# Patient Record
Sex: Female | Born: 1980
Health system: Southern US, Community
[De-identification: ages and names within clinical notes are randomized; demographics above are authoritative.]

## PROBLEM LIST (undated history)

## (undated) DIAGNOSIS — I10 Essential (primary) hypertension: Secondary | ICD-10-CM

## (undated) DIAGNOSIS — N189 Chronic kidney disease, unspecified: Secondary | ICD-10-CM

## (undated) DIAGNOSIS — D496 Neoplasm of unspecified behavior of brain: Secondary | ICD-10-CM

## (undated) DIAGNOSIS — F1010 Alcohol abuse, uncomplicated: Secondary | ICD-10-CM

## (undated) DIAGNOSIS — R569 Unspecified convulsions: Secondary | ICD-10-CM

## (undated) HISTORY — DX: Essential (primary) hypertension: I10

## (undated) HISTORY — DX: Chronic kidney disease, unspecified: N18.9

## (undated) HISTORY — PX: BRAIN SURGERY: SHX531

## (undated) HISTORY — DX: Alcohol abuse, uncomplicated: F10.10

## (undated) HISTORY — DX: Unspecified convulsions: R56.9

---
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
## 2002-10-27 ENCOUNTER — Ambulatory Visit (HOSPITAL_COMMUNITY): Admission: RE | Admit: 2002-10-27 | Discharge: 2002-10-27 | Payer: Self-pay | Admitting: Neurology

## 2002-10-27 ENCOUNTER — Encounter: Payer: Self-pay | Admitting: Neurology

## 2005-05-30 ENCOUNTER — Ambulatory Visit: Payer: Self-pay | Admitting: Internal Medicine

## 2010-02-27 ENCOUNTER — Encounter: Payer: Self-pay | Admitting: Neurology

## 2011-06-08 ENCOUNTER — Emergency Department (HOSPITAL_COMMUNITY)
Admission: EM | Admit: 2011-06-08 | Discharge: 2011-06-08 | Disposition: A | Discharge status: Home / Self Care | Payer: BC Managed Care – PPO | Source: Home / Self Care | Attending: Emergency Medicine | Admitting: Emergency Medicine

## 2011-06-08 ENCOUNTER — Encounter (HOSPITAL_COMMUNITY): Payer: Self-pay | Admitting: Emergency Medicine

## 2011-06-08 ENCOUNTER — Emergency Department (INDEPENDENT_AMBULATORY_CARE_PROVIDER_SITE_OTHER): Payer: BC Managed Care – PPO

## 2011-06-08 DIAGNOSIS — S6000XA Contusion of unspecified finger without damage to nail, initial encounter: Secondary | ICD-10-CM

## 2011-06-08 DIAGNOSIS — S60019A Contusion of unspecified thumb without damage to nail, initial encounter: Secondary | ICD-10-CM

## 2011-06-08 DIAGNOSIS — T148XXA Other injury of unspecified body region, initial encounter: Secondary | ICD-10-CM

## 2011-06-08 DIAGNOSIS — IMO0002 Reserved for concepts with insufficient information to code with codable children: Secondary | ICD-10-CM

## 2011-06-08 DIAGNOSIS — X58XXXA Exposure to other specified factors, initial encounter: Secondary | ICD-10-CM

## 2011-06-08 HISTORY — DX: Neoplasm of unspecified behavior of brain: D49.6

## 2011-06-08 MED ORDER — HYDROCODONE-ACETAMINOPHEN 5-325 MG PO TABS: 1.0000 | ORAL_TABLET | Freq: Four times a day (QID) | ORAL | Status: AC | PRN

## 2011-06-08 NOTE — Discharge Instructions (Signed)
As discussed your x-rays were unremarkable there was no associated distal phalanx fracture. He sustained a flap type laceration that was not candidate for suture repair read discharge instructions as tolerated try to move her finger as tolerated and 3-5 days try to keep your thumb elevated as much as possible.

## 2011-06-08 NOTE — ED Provider Notes (Signed)
History     CSN: [id number]  Arrival date & time 06/08/11  0825   First MD Initiated Contact with Patient 06/08/11 [phone]      Chief Complaint  Patient presents with  . Laceration    (Consider location/radiation/quality/duration/timing/severity/associated sxs/prior treatment) HPI Comments: Patient states that last night she was attempting to close a sliding glass door when her thumb got caught and cut. She washed her thumb with water and apply a topical antibiotic last night. Had it covered with a Band-Aid it had some bleeding last night but it stopped. I know I got my tetanus shot 2009. His very painful and the tip of my thumb  Patient is a 31 y.o. female presenting with skin laceration. The history is provided by the patient.  Laceration  The incident occurred 12 to 24 hours ago. The laceration is located on the right hand. The laceration is 1 cm in size. Injury mechanism: crush type injure with metal door. The pain is moderate. The pain has been constant since onset. She reports no foreign bodies present. Her tetanus status is UTD.    Past Medical History  Diagnosis Date  . Brain tumor     BENIGN REMOVAL JULY 2001    Past Surgical History  Procedure Date  . Brain surgery     2001    No family history on file.  History  Substance Use Topics  . Smoking status: Current Everyday Smoker  . Smokeless tobacco: Not on file  . Alcohol Use: Yes    OB History    Grav Para Term Preterm Abortions TAB SAB Ect Mult Living                  Review of Systems  Constitutional: Negative for activity change and appetite change.  Skin: Positive for wound.  Neurological: Negative for weakness and numbness.    Allergies  Review of patient's allergies indicates no known allergies.  Home Medications   Current Outpatient Rx  Name Route Sig Dispense Refill  . LORATADINE 10 MG PO TABS Oral Take 10 mg by mouth daily.      LMP 05/17/2011  Physical Exam  Nursing note and vitals  reviewed. Constitutional: She appears well-developed and well-nourished. No distress.  Musculoskeletal: She exhibits tenderness.       Hands: Neurological: She is alert.  Skin: No rash noted. No erythema.    ED Course  Procedures (including critical care time)  Labs Reviewed - No data to display No results found.   No diagnosis found.    MDM  Flap-type laceration on the distal right thumb supraungueal- area. No subfoveal hematoma. Patient able to flex the IP and extend the finger properly. No distal vascular deficiencies. Laceration was not repairable sutures a small flap with a fragment of epidermis missing. We approximated the edges and perform wound care. We are ruling out a associated tuft fracture. X-rays have been ordered        Jimmie Molly, MD 06/08/11 [phone]

## 2011-06-08 NOTE — ED Notes (Signed)
HERE WITH 1 CM THUMB LACERATION AROUND NAILBED WITH MINIMAL BLEEDING CONTROLLED BY PRESSURE DRESSING S/P INJURY LAST NIGHT.OPT STATES SHE WAS ATTEMPTING TO CLOSE SLIDING GLASS DOOR WHEN DIGIT GOT CAUGHT.NO NUMB/TINGLING +ROM,BENDING.LAST TETANUS 2009.REMOVED BANDAID AND SATURATED GAUZE

## 2019-05-02 HISTORY — PX: ESOPHAGOGASTRODUODENOSCOPY: SHX1529

## 2019-05-15 ENCOUNTER — Encounter: Payer: Self-pay | Admitting: Nurse Practitioner

## 2019-05-15 ENCOUNTER — Other Ambulatory Visit: Payer: Self-pay

## 2019-05-15 ENCOUNTER — Ambulatory Visit (INDEPENDENT_AMBULATORY_CARE_PROVIDER_SITE_OTHER): Payer: PRIVATE HEALTH INSURANCE | Admitting: Nurse Practitioner

## 2019-05-15 ENCOUNTER — Telehealth: Payer: Self-pay | Admitting: Nurse Practitioner

## 2019-05-15 VITALS — BP 116/62 | HR 75 | Temp 97.8°F | Ht 68.0 in | Wt 183.4 lb

## 2019-05-15 DIAGNOSIS — K709 Alcoholic liver disease, unspecified: Secondary | ICD-10-CM

## 2019-05-15 DIAGNOSIS — R160 Hepatomegaly, not elsewhere classified: Secondary | ICD-10-CM

## 2019-05-15 DIAGNOSIS — K7682 Hepatic encephalopathy: Secondary | ICD-10-CM

## 2019-05-15 DIAGNOSIS — K7030 Alcoholic cirrhosis of liver without ascites: Secondary | ICD-10-CM | POA: Diagnosis not present

## 2019-05-15 DIAGNOSIS — D689 Coagulation defect, unspecified: Secondary | ICD-10-CM

## 2019-05-15 DIAGNOSIS — K7290 Hepatic failure, unspecified without coma: Secondary | ICD-10-CM | POA: Diagnosis not present

## 2019-05-15 DIAGNOSIS — K920 Hematemesis: Secondary | ICD-10-CM

## 2019-05-15 MED ORDER — RIFAXIMIN 550 MG PO TABS: 550.0000 mg | ORAL_TABLET | Freq: Two times a day (BID) | ORAL | 2 refills | Status: DC

## 2019-05-15 NOTE — Patient Instructions (Addendum)
If you are age 37 or older, your body mass index should be between 23-30. Your Body mass index is 27.88 kg/m. If this is out of the aforementioned range listed, please consider follow up with your Primary Care Provider.  If you are age 26 or younger, your body mass index should be between 19-25. Your Body mass index is 27.88 kg/m. If this is out of the aformentioned range listed, please consider follow up with your Primary Care Provider.   Please go to the lab at [facility] ([place] You will need to go to level "B", you do not need an appointment for this. Hours available are 7:30 am - 4:30 pm.  Please have these drawn tomorrow 05/16/19.  We have sent the following medications to your pharmacy for you to pick up at your convenience: Xifaxan 550 mg twice daily.   You have been referred to Cardiology, you will receive a call regarding this appointment.   Follow up in office in 1 week as scheduled.   Please purchase the following medications over the counter and take as directed: Multivitamin once daily.  Folic Acid 1 mg once daily.  Thiamine 50 mg once daily.   No alcohol use.   2 Gram Low Sodium Diet    Low-Sodium Eating Plan Sodium, which is an element that makes up salt, helps you maintain a healthy balance of fluids in your body. Too much sodium can increase your blood pressure and cause fluid and waste to be held in your body. Your health care provider or dietitian may recommend following this plan if you have high blood pressure (hypertension), kidney disease, liver disease, or heart failure. Eating less sodium can help lower your blood pressure, reduce swelling, and protect your heart, liver, and kidneys. What are tips for following this plan? General guidelines  Most people on this plan should limit their sodium intake to 1,500-2,000 mg (milligrams) of sodium each day. Reading food labels   The Nutrition Facts label lists the amount of sodium in one  serving of the food. If you eat more than one serving, you must multiply the listed amount of sodium by the number of servings.  Choose foods with less than 140 mg of sodium per serving.  Avoid foods with 300 mg of sodium or more per serving. Shopping  Look for lower-sodium products, often labeled as "low-sodium" or "no salt added."  Always check the sodium content even if foods are labeled as "unsalted" or "no salt added".  Buy fresh foods. ? Avoid canned foods and premade or frozen meals. ? Avoid canned, cured, or processed meats  Buy breads that have less than 80 mg of sodium per slice. Cooking  Eat more home-cooked food and less restaurant, buffet, and fast food.  Avoid adding salt when cooking. Use salt-free seasonings or herbs instead of table salt or sea salt. Check with your health care provider or pharmacist before using salt substitutes.  Cook with plant-based oils, such as canola, sunflower, or olive oil. Meal planning  When eating at a restaurant, ask that your food be prepared with less salt or no salt, if possible.  Avoid foods that contain MSG (monosodium glutamate). MSG is sometimes added to [place] food, bouillon, and some canned foods. What foods are recommended? The items listed may not be a complete list. Talk with your dietitian about what dietary choices are best for you. Grains Low-sodium cereals, including oats, puffed wheat and rice, and shredded wheat. Low-sodium crackers. Unsalted  rice. Unsalted pasta. Low-sodium bread. Whole-grain breads and whole-grain pasta. Vegetables Fresh or frozen vegetables. "No salt added" canned vegetables. "No salt added" tomato sauce and paste. Low-sodium or reduced-sodium tomato and vegetable juice. Fruits Fresh, frozen, or canned fruit. Fruit juice. Meats and other protein foods Fresh or frozen (no salt added) meat, poultry, seafood, and fish. Low-sodium canned tuna and salmon. Unsalted nuts. Dried peas, beans, and lentils  without added salt. Unsalted canned beans. Eggs. Unsalted nut butters. Dairy Milk. Soy milk. Cheese that is naturally low in sodium, such as ricotta cheese, fresh mozzarella, or Swiss cheese Low-sodium or reduced-sodium cheese. Cream cheese. Yogurt. Fats and oils Unsalted butter. Unsalted margarine with no trans fat. Vegetable oils such as canola or olive oils. Seasonings and other foods Fresh and dried herbs and spices. Salt-free seasonings. Low-sodium mustard and ketchup. Sodium-free salad dressing. Sodium-free light mayonnaise. Fresh or refrigerated horseradish. Lemon juice. Vinegar. Homemade, reduced-sodium, or low-sodium soups. Unsalted popcorn and pretzels. Low-salt or salt-free chips. What foods are not recommended? The items listed may not be a complete list. Talk with your dietitian about what dietary choices are best for you. Grains Instant hot cereals. Bread stuffing, pancake, and biscuit mixes. Croutons. Seasoned rice or pasta mixes. Noodle soup cups. Boxed or frozen macaroni and cheese. Regular salted crackers. Self-rising flour. Vegetables Sauerkraut, pickled vegetables, and relishes. Olives. [place] fries. Onion rings. Regular canned vegetables (not low-sodium or reduced-sodium). Regular canned tomato sauce and paste (not low-sodium or reduced-sodium). Regular tomato and vegetable juice (not low-sodium or reduced-sodium). Frozen vegetables in sauces. Meats and other protein foods Meat or fish that is salted, canned, smoked, spiced, or pickled. Bacon, ham, sausage, hotdogs, corned beef, chipped beef, packaged lunch meats, salt pork, jerky, pickled herring, anchovies, regular canned tuna, sardines, salted nuts. Dairy Processed cheese and cheese spreads. Cheese curds. Blue cheese. Feta cheese. String cheese. Regular cottage cheese. Buttermilk. Canned milk. Fats and oils Salted butter. Regular margarine. Ghee. Bacon fat. Seasonings and other foods Onion salt, garlic salt, seasoned salt,  table salt, and sea salt. Canned and packaged gravies. Worcestershire sauce. Tartar sauce. Barbecue sauce. Teriyaki sauce. Soy sauce, including reduced-sodium. Steak sauce. Fish sauce. Oyster sauce. Cocktail sauce. Horseradish that you find on the shelf. Regular ketchup and mustard. Meat flavorings and tenderizers. Bouillon cubes. Hot sauce and Tabasco sauce. Premade or packaged marinades. Premade or packaged taco seasonings. Relishes. Regular salad dressings. Salsa. Potato and tortilla chips. Corn chips and puffs. Salted popcorn and pretzels. Canned or dried soups. Pizza. Frozen entrees and pot pies. Summary  Eating less sodium can help lower your blood pressure, reduce swelling, and protect your heart, liver, and kidneys.  Most people on this plan should limit their sodium intake to 1,500-2,000 mg (milligrams) of sodium each day.  Canned, boxed, and frozen foods are high in sodium. Restaurant foods, fast foods, and pizza are also very high in sodium. You also get sodium by adding salt to food.  Try to cook at home, eat more fresh fruits and vegetables, and eat less fast food, canned, processed, or prepared foods. This information is not intended to replace advice given to you by your health care provider. Make sure you discuss any questions you have with your health care provider. Document Revised: 01/05/2017 Document Reviewed: 01/17/2016 Elsevier Patient Education  2020 [place] you,  Carl Best, NP

## 2019-05-15 NOTE — Telephone Encounter (Signed)
Rec'd records from [facility] forwarded 60 pages tp Carl Best

## 2019-05-15 NOTE — Progress Notes (Signed)
05/15/2019 Frances Williams [id number] 1980-04-08   CHIEF COMPLAINT: Liver disease   HISTORY OF PRESENT ILLNESS:  Frances Williams is a 39 year old female with a past medical history of hypertension alcoholism. 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 She presents today for further evaluation for alcoholic cirrhosis. She was residing in [place] and she traveled to [place], [place] 4 days ago to be with her family members.  She is accompanied by her significant other, Frances Williams. He is assisting with obtaining her history.  She was admitted to [facility] in [place] 05/01/2019 with altered mental status, hematemesis with newly diagnosed alcoholic liver disease. 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 She initially vomited partial digested food the bilious emesis. She then vomited coffee ground hematemesis. Her family assessed she had altered mental status and they called 911. She was actively having hematemesis when the EMS paramedics arrive. She was nearly obtunded and she was intubated by EMS paramedic with concerns of airway compromise. She was initially transported to the local hospital in [place] then air lifted by helicopter to [facility] in [place]. She was admitted to the ICU hypotensive in septic shock, actively GI bleeding and AKI. She remained intubated and sedated. Admission WBC 46.41. Hg 6.7 -> 5.2. She was transfused 2 units of PRBCs. INR 3. She received 3 units of FFP and Vitamin K 27m IV. PLT 238. She was was started on a Norepinephrine gtt, Octreotide infusion and Pantoprazole 860mhr infusion. She received Ceftriaxone 2gm IV. Rectal exam showed melenic stool guaiac positive. EKG showed ST-Twave changes due to demand ischemia with elevated Troponin level 668. BNP 308. Lactic acid 14.3. Ammonia 143. Na+ 128. K+5.4. Cr. 2.5. T. Bili 3.0. Alk phos 97. ALT  78. AST 403. Lipase 77. Albumin 2.8. TSH 2.27. Acetaminophen level < 10. Urine tox screen negative.  A chest/abd/pelvic CT angiogram was negative for PE, hepatomegaly was identified and peripancreatic stranding with fluid collection suggesting pancreatitis was present. Lipase levels were normal. CT of the head without evidence of stroke of hemorrhage. She was seen by cardiology who reviewed her EKG showed tachycardia and possible recent inferior MI. The cardiologist did not assess she was a candidate for any invasive cardiac evaluation at that time. An ECHO done while she was tachycardic showed the left ventricle was hyperdynamic with LV EF 80%, mild LVH, normal chamber sizes, doppler studies were unremarkable.  She underwent an EGD 3/26 which revealed esophageal ulcerations, nonbleeding duodenal  bulb ulcers and chronic gastropathy. No evidence of esophageal or gastric varices. Blood cultures were negative. She had persistent fevers. Tracheal aspirate cultures grew Methicillin-susceptible staphylococcus aureus. ID was consulted and her antibiotics were switched to Cefazolin and Clindamycin. An abdominal sonogram 4/1 showed mild diffuse gallbladder wall thickening and mild para cholecystic fluid, slightly dilated CBD without evidence of gallstones. Acute cholecystitis was assessed as unlikely. Ascites was identified. She underwent a paracentesis 05/09/2019, 60056mf peritoneal fluid was removed without evidence of SBP. Peritoneal fluid culture negative after 72 hours. Her respiratory status improved and she was extubated without difficulty. AKI resolved. Multifactorial encephalopathy resolved on Lactulose. No further evidence of GI bleeding. Her Hg stabilized 8.5 - 9. Coagulopathy stabilized.   Laboratory studies prior to discharge 05/12/2018: WBC 11.6. Hg 9.0. HCT 26.6. MCV 93.1. PLT 207. INR 1.3. (on 4/2).  Glu 92. BUN 7. Cr. 0.43. Na 138. K+ 3.4.  Albumin 3.3. Alk phos 88. ALT 74. AST 175. T. Bili 2.5.    Medications at time of discharge:  Aspirin 81 mg daily (she is not taking). Escitalopram 39m daily.  Furosemide 412mdaily. Spironolactone 5058maily. Metoprolol 12.5 mg bid. Pantoprazole 39m36m bid. Rifaxamin 550mg4mbid (patient has not started) Lactulose (10gm/15ml 91m ML bid   Currently, she is fatigued. No cough but she has dyspnea with activity. No chest pain or palpitations. She denies having any nausea or vomiting. No upper or lower abdominal pain. She is passing 4 to 6 soft brown to loose stools daily since taking Lactulose. No prior history of rectal bleeding or melena. She is urinating frequently, urine is clear yellow urine. She denies having current confusion. Her boyfriend does not assess she has confusion since she was discharged from the hospital. She bruises easily for the past 3 months and she had a few minor nose bleeds as well. She complains of numbness to her feet and cramping in her calves for the past few months. She has a history of alcoholism since her early 20's. 30'sdrank 3 beers daily since the age of 23. Fo95the past 1 1/2 years she has been drinking seven 50ml b49mes of fireball whiskey. She smoked crack cocaine x 2 7 or 8 years ago. No history of IV drug use. Past marijuana use. She denied Tylenol overdose.  No family history of liver disease.  [facility]    Past Medical History:  Diagnosis Date   Brain [facility] (HCC)  [facility]   Past Surgical History:  Procedure Laterality Date   BRAIN SURGERY     2001 on temporal lobe    Social History: She is single. Past smoker. Alcohol use see HPI. Crack cocaine use x 2 6 or 8 years ago. Marijuana use.   Family History: No family history of liver disease, esophageal, gastric or colon cancer.     Outpatient Encounter Medications as of 05/15/2019  Medication Sig   escitalopram (LEXAPRO) 20 MG tablet Take 20 mg by  mouth daily.   furosemide (LASIX) 40 MG tablet Take 40 mg by mouth daily.   lactulose (CHRONULAC) 10 GM/15ML solution Take 30 g by mouth every 12 (twelve) hours. Currently doing 30ml 2 60ms daily. Was prescribe 45ml 2 t44m daily   metoprolol tartrate (LOPRESSOR) 25 MG tablet Take 12.5 mg by mouth 2 (two) times daily.   pantoprazole (PROTONIX) 40 MG tablet Take 40 mg by mouth 2 (two) times daily.   spironolactone (ALDACTONE) 50 MG tablet Take 50 mg by mouth daily.   [DISCONTINUED] loratadine (CLARITIN) 10 MG tablet Take 10 mg by mouth daily.   No facility-administered encounter medications on file as of 05/15/2019.     REVIEW OF SYSTEMS: All other systems reviewed and negative except where noted in the History of Present Illness.   PHYSICAL EXAM: Temp 97.8 F (36.6 C)    Ht 5' 8"  (1.727 m)    Wt 183 lb 6 oz (83.2 kg)    BMI 27.88 kg/m    General: Fatigued appearing female in NAD.  Head: Normocephalic and atraumatic. Eyes:  Sclerae non-icteric, conjunctive pink. Right subconjuctival hemorrhage.  Ears: Normal auditory acuity. Mouth: Dentition intact. No ulcers or lesions.  Neck: Supple, no lymphadenopathy or thyromegaly.  Lungs: Clear bilaterally to auscultation without  wheezes, crackles or rhonchi. Heart: Regular rate and rhythm. No murmur, rub or gallop appreciated.  Abdomen: Soft. Nondistended. Nontender. Hepatomegaly with pronounced left hepatic border palpated 4 to 5 cm below the medial costal margin. No obvious ascites. + BS x 4 quadrants.   Rectal: Deferred.  Musculoskeletal: Symmetrical with no gross deformities. Skin: Warm and dry. No rash or lesions on visible extremities. No jaundice.  Extremities: 1+ LE edema L > R. Negative Homan's sign.  Neurological: Alert oriented x 4, no focal deficits. No asterixis.  Psychological:  Alert and cooperative. Normal mood and affect.  ASSESSMENT AND PLAN:  18. 40 year old female admitted to [facility] [place] 05/01/2019 with  hematemesis/UGI and anemia in setting of newly diagnosed EtOH liver disease/hepatomegaly/acites in setting of septic shock, coagulopathy, respiratory failure secondary to aspiration pneumonia and AKI. Most likely has cirrhosis. No esophageal or gastric varices per EGD 3/26.  -CBC, BMP, Hepatic panel, AMA, SMA, ANA, IgG, Hep A total ab, Hep B core total antibody, Hep B surface antigen. Hep B surface antibody, Ceruloplasmin, A1AT,  PT/INR, Ammonia, AFP.  -Continue Furosemide 49m QD and Spironolactone 570mdaily -Start Xifaxan 55026mne po bid  -Lactulose (10gm/77m37m0ml62m titrate to 3 to 4 soft to loose BMs daily -Continue Pantoprazole 40mg 65mDiscussed no alcohol ever, if she continues to drink alcohol she will die -Agree with inpatient alcohol rehab  -2Gm low sodium diet -Thiamin 50mg o17mab daily. Multi vitamin daily. Folate 1mg dai26m  -I would not fluid restrict unless Na+ level 125 or less -Metoprolol use to be verified by cardiology in setting of possible recent MI. -Monitor weight  -Follow up in office in 1 week  2. Encephalopathy, metabolic/sepsis + alcoholic liver disease, currently resolved  -Xifaxan and Lactulose, see plan in # 1  3. Coagulopathy secondary to liver disease   4. Thrombocytopenia secondary to sepsis + liver disease. PLT 239 -> 140 -> 89 -> 92 -> 113 -> 207.   5. ? Pancreatitis per abd CT angiogram 3/25. Normal lipase level.   6. Acute MI verses demand ischemia -Cardiology consult  Consult today include 60 minutes face to face time with patient and review of 60+ pages of hospital records.     CC:  No ref. provider found

## 2019-05-16 ENCOUNTER — Other Ambulatory Visit (INDEPENDENT_AMBULATORY_CARE_PROVIDER_SITE_OTHER): Payer: PRIVATE HEALTH INSURANCE

## 2019-05-16 DIAGNOSIS — R160 Hepatomegaly, not elsewhere classified: Secondary | ICD-10-CM

## 2019-05-16 DIAGNOSIS — K7290 Hepatic failure, unspecified without coma: Secondary | ICD-10-CM | POA: Diagnosis not present

## 2019-05-16 DIAGNOSIS — K7030 Alcoholic cirrhosis of liver without ascites: Secondary | ICD-10-CM | POA: Diagnosis not present

## 2019-05-16 DIAGNOSIS — K7682 Hepatic encephalopathy: Secondary | ICD-10-CM

## 2019-05-16 DIAGNOSIS — K920 Hematemesis: Secondary | ICD-10-CM

## 2019-05-16 DIAGNOSIS — K709 Alcoholic liver disease, unspecified: Secondary | ICD-10-CM

## 2019-05-16 DIAGNOSIS — D689 Coagulation defect, unspecified: Secondary | ICD-10-CM

## 2019-05-16 HISTORY — DX: Alcoholic liver disease, unspecified: K70.9

## 2019-05-16 HISTORY — DX: Hematemesis: K92.0

## 2019-05-16 HISTORY — DX: Hepatomegaly, not elsewhere classified: R16.0

## 2019-05-16 LAB — CBC WITH DIFFERENTIAL/PLATELET
Basophils Absolute: 0.1 10*3/uL (ref 0.0–0.1)
Basophils Relative: 1.4 % (ref 0.0–3.0)
Eosinophils Absolute: 0.1 10*3/uL (ref 0.0–0.7)
Eosinophils Relative: 1.4 % (ref 0.0–5.0)
HCT: 31.2 % — ABNORMAL LOW (ref 36.0–46.0)
Hemoglobin: 10.3 g/dL — ABNORMAL LOW (ref 12.0–15.0)
Lymphocytes Relative: 30.3 % (ref 12.0–46.0)
Lymphs Abs: 2.8 10*3/uL (ref 0.7–4.0)
MCHC: 32.9 g/dL (ref 30.0–36.0)
MCV: 93.4 fl (ref 78.0–100.0)
Monocytes Absolute: 0.7 10*3/uL (ref 0.1–1.0)
Monocytes Relative: 7.8 % (ref 3.0–12.0)
Neutro Abs: 5.4 10*3/uL (ref 1.4–7.7)
Neutrophils Relative %: 59.1 % (ref 43.0–77.0)
Platelets: 250 10*3/uL (ref 150.0–400.0)
RBC: 3.34 Mil/uL — ABNORMAL LOW (ref 3.87–5.11)
RDW: 19.4 % — ABNORMAL HIGH (ref 11.5–15.5)
WBC: 9.1 10*3/uL (ref 4.0–10.5)

## 2019-05-16 LAB — HEPATIC FUNCTION PANEL
ALT: 52 U/L — ABNORMAL HIGH (ref 0–35)
AST: 92 U/L — ABNORMAL HIGH (ref 0–37)
Albumin: 4 g/dL (ref 3.5–5.2)
Alkaline Phosphatase: 103 U/L (ref 39–117)
Bilirubin, Direct: 1 mg/dL — ABNORMAL HIGH (ref 0.0–0.3)
Total Bilirubin: 3.3 mg/dL — ABNORMAL HIGH (ref 0.2–1.2)
Total Protein: 7.7 g/dL (ref 6.0–8.3)

## 2019-05-16 LAB — AMMONIA: Ammonia: 105 umol/L — ABNORMAL HIGH (ref 11–35)

## 2019-05-16 LAB — BASIC METABOLIC PANEL
BUN: 10 mg/dL (ref 6–23)
CO2: 24 mEq/L (ref 19–32)
Calcium: 9.9 mg/dL (ref 8.4–10.5)
Chloride: 102 mEq/L (ref 96–112)
Creatinine, Ser: 0.63 mg/dL (ref 0.40–1.20)
GFR: 106.58 mL/min (ref >60.00)
Glucose, Bld: 115 mg/dL — ABNORMAL HIGH (ref 70–99)
Potassium: 3.9 mEq/L (ref 3.5–5.1)
Sodium: 137 mEq/L (ref 135–145)

## 2019-05-16 LAB — PROTIME-INR
INR: 1.4 ratio — ABNORMAL HIGH (ref 0.8–1.0)
Prothrombin Time: 15.1 s — ABNORMAL HIGH (ref 9.6–13.1)

## 2019-05-16 LAB — IBC + FERRITIN
Ferritin: 125.4 ng/mL (ref 10.0–291.0)
Iron: 31 ug/dL — ABNORMAL LOW (ref 42–145)
Saturation Ratios: 8.3 % — ABNORMAL LOW (ref 20.0–50.0)
Transferrin: 266 mg/dL (ref 212.0–360.0)

## 2019-05-16 LAB — TSH: TSH: 3.87 u[IU]/mL (ref 0.35–4.50)

## 2019-05-16 LAB — FOLATE: Folate: >23.7 ng/mL (ref >5.9)

## 2019-05-16 LAB — VITAMIN B12: Vitamin B-12: 1131 pg/mL — ABNORMAL HIGH (ref 211–911)

## 2019-05-20 ENCOUNTER — Encounter: Payer: Self-pay | Admitting: Cardiology

## 2019-05-20 ENCOUNTER — Other Ambulatory Visit: Payer: Self-pay

## 2019-05-20 ENCOUNTER — Ambulatory Visit (INDEPENDENT_AMBULATORY_CARE_PROVIDER_SITE_OTHER): Payer: PRIVATE HEALTH INSURANCE | Admitting: Cardiology

## 2019-05-20 DIAGNOSIS — I252 Old myocardial infarction: Secondary | ICD-10-CM | POA: Diagnosis not present

## 2019-05-20 DIAGNOSIS — K920 Hematemesis: Secondary | ICD-10-CM

## 2019-05-20 DIAGNOSIS — K709 Alcoholic liver disease, unspecified: Secondary | ICD-10-CM | POA: Diagnosis not present

## 2019-05-20 HISTORY — DX: Old myocardial infarction: I25.2

## 2019-05-20 NOTE — Patient Instructions (Signed)

## 2019-05-20 NOTE — Progress Notes (Signed)
Cardiology Consultation:    Date:  05/20/2019   ID:  Frances Williams, DOB October 02, 1980, MRN [id number]  PCP:  Patient, No Pcp Per  Cardiologist:  Jenne Campus, MD   Referring MD: Carl Best *   No chief complaint on file. Complaint bladder  History of Present Illness:    Frances Williams is a 39 y.o. female who is being seen today for the evaluation of history of myocardial infarction at the request of Frances Williams, 3 *. old female with a past medical history of hypertension alcoholism. 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 She presents today for further evaluation for alcoholic cirrhosis. She was residing in [place] and she traveled to [place], [place] 4 days ago to be with her family members.  She is accompanied by her significant other, Rick. He is assisting with obtaining her history.  She was admitted to [facility] in [place] 05/01/2019 with altered mental status, hematemesis with newly diagnosed alcoholic liver disease. 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 She initially vomited partial digested food the bilious emesis. She then vomited coffee Williams hematemesis. Her family assessed she had altered mental status and they called 911. She was actively having hematemesis when the EMS paramedics arrive. She was nearly obtunded and she was intubated by EMS paramedic with concerns of airway compromise. She was initially transported to the local hospital in [place] then air lifted by helicopter to [facility] in [place]. She was admitted to the ICU hypotensive in septic shock, actively GI bleeding and AKI. She remained intubated and sedated. Admission WBC 46.41. Hg 6.7 -> 5.2. She was transfused 2 units of PRBCs. INR 3. She received 3 units of FFP and Vitamin K 52m IV. PLT 238. She was was started on a Norepinephrine gtt,  Octreotide infusion and Pantoprazole 824mhr infusion. She received Ceftriaxone 2gm IV. Rectal exam showed melenic stool guaiac positive. EKG showed ST-Twave changes due to demand ischemia with elevated Troponin level 668. BNP 308. Lactic acid 14.3. Ammonia 143. Na+ 128. K+5.4. Cr. 2.5. T. Bili 3.0. Alk phos 97. ALT 78. AST 403. Lipase 77. Albumin 2.8. TSH 2.27. Acetaminophen level < 10. Urine tox screen negative.  A chest/abd/pelvic CT angiogram was negative for PE, hepatomegaly was identified and peripancreatic stranding with fluid collection suggesting pancreatitis was present. Lipase levels were normal. CT of the head without evidence of stroke of hemorrhage. She was seen by cardiology who reviewed her EKG showed tachycardia and possible recent inferior MI. The cardiologist did not assess she was a candidate for any invasive cardiac evaluation at that time. An ECHO done while she was tachycardic showed the left ventricle was hyperdynamic with LV EF 80%, mild LVH, normal chamber sizes, doppler studies were unremarkable.  She underwent an EGD 3/26 which revealed esophageal ulcerations, nonbleeding duodenal  bulb ulcers and chronic gastropathy. No evidence of esophageal or gastric varices. Blood cultures were negative. She had persistent fevers. Tracheal aspirate cultures grew Methicillin-susceptible staphylococcus aureus. ID was consulted and her antibiotics were switched to Cefazolin and Clindamycin. An abdominal sonogram 4/1 showed mild diffuse gallbladder wall thickening and mild para cholecystic fluid, slightly dilated CBD without evidence of gallstones. Acute cholecystitis was assessed as unlikely. Ascites was identified. She underwent a paracentesis 05/09/2019, 6007mf peritoneal fluid was removed without evidence of SBP. Peritoneal fluid culture negative after  72 hours. Her respiratory status improved and she was extubated without difficulty. AKI resolved. Multifactorial encephalopathy resolved on Lactulose.  No further evidence of GI bleeding. Her Hg stabilized 8.5 - 9. Coagulopathy stabilized.   This is a quite incredible story.  In summary of she did have acute coronary syndrome type II related to severe anemia as well as hypotension.  Obviously anticoagulation is out of the question of recent history of GI bleeding on top of that with acute coronary syndrome that could smoke beneficial.  She is already on small dose of beta-blocker which is probably appropriate.  Likely description of echocardiogram that I see above show normal ejection fraction.  Overall she is doing well.  She denies have any chest pain tightness squeezing pressure in the chest that she does have fatigue and tiredness while walking.  Past Medical History:  Diagnosis Date  . Alcohol abuse   . Alcoholic liver disease ([place]) 05/16/2019  . Brain tumor ([place])    BENIGN REMOVAL JULY 2001  . Chronic kidney disease   . Hematemesis 05/16/2019  . Hepatomegaly 05/16/2019  . Hypertension   . Seizure [facility])     Past Surgical History:  Procedure Laterality Date  . BRAIN SURGERY     2001 on temporal lobe  . ESOPHAGOGASTRODUODENOSCOPY  05/02/2019   [facility] [place]    Current Medications: Current Meds  Medication Sig  . escitalopram (LEXAPRO) 20 MG tablet Take 20 mg by mouth daily.  . furosemide (LASIX) 40 MG tablet Take 40 mg by mouth daily.  Marland Kitchen lactulose (CHRONULAC) 10 GM/15ML solution Take 45 g by mouth 2 (two) times daily.  . metoprolol tartrate (LOPRESSOR) 25 MG tablet Take 12.5 mg by mouth 2 (two) times daily.  . pantoprazole (PROTONIX) 40 MG tablet Take 40 mg by mouth 2 (two) times daily.     Allergies:   Patient has no known allergies.   Social History   Socioeconomic History  . Marital status: Single    Spouse name: Not on file  . Number of children: Not on file  . Years of education: Not on file  . Highest education level: Not on file  Occupational History  . Not on file  Tobacco Use  . Smoking status:  Former Research scientist (life sciences)  . Smokeless tobacco: Never Used  . Tobacco comment: quit 05/01/2019  Substance and Sexual Activity  . Alcohol use: Not Currently    Comment: quit 05/01/2019  . Drug use: Not Currently    Types: "Crack" cocaine, Marijuana  . Sexual activity: Not on file  Other Topics Concern  . Not on file  Social History Narrative  . Not on file   Social Determinants of Health   Financial Resource Strain:   . Difficulty of Paying Living Expenses:   Food Insecurity:   . Worried About Charity fundraiser in the Last Year:   . Arboriculturist in the Last Year:   Transportation Needs:   . Film/video editor (Medical):   Marland Kitchen Lack of Transportation (Non-Medical):   Physical Activity:   . Days of Exercise per Week:   . Minutes of Exercise per Session:   Stress:   . Feeling of Stress :   Social Connections:   . Frequency of Communication with Friends and Family:   . Frequency of Social Gatherings with Friends and Family:   . Attends Religious Services:   . Active Member of Clubs or Organizations:   . Attends Archivist Meetings:   .  Marital Status:      Family History: The patient's family history is negative for Colon cancer and Esophageal cancer. ROS:   Please see the history of present illness.    All 14 point review of systems negative except as described per history of present illness.  EKGs/Labs/Other Studies Reviewed:    The following studies were reviewed today:   EKG:  EKG is  ordered today.  The ekg ordered today demonstrates normal sinus rhythm, normal P interval, normal QS complex duration morphology no ST segment changes  Recent Labs: 05/16/2019: ALT 52; BUN 10; Creatinine, Ser 0.63; Hemoglobin 10.3; Platelets 250.0; Potassium 3.9; Sodium 137; TSH 3.87  Recent Lipid Panel No results found for: CHOL, TRIG, HDL, CHOLHDL, VLDL, LDLCALC, LDLDIRECT  Physical Exam:    VS:  BP 126/70   Pulse 69   Temp 97.7 F (36.5 C)   Ht 5' 8.5" (1.74 m)   Wt 212  lb 12.8 oz (96.5 kg)   SpO2 96%   BMI 31.89 kg/m     Wt Readings from Last 3 Encounters:  05/20/19 212 lb 12.8 oz (96.5 kg)  05/15/19 183 lb 6 oz (83.2 kg)     GEN:  Well nourished, well developed in no acute distress multiple spider angiomas in the chest wall.  HEENT: Normal NECK: No JVD; No carotid bruits LYMPHATICS: No lymphadenopathy CARDIAC: RRR, no murmurs, no rubs, no gallops RESPIRATORY:  Clear to auscultation without rales, wheezing or rhonchi  ABDOMEN: Soft, non-tender, non-distended, hepatomegaly MUSCULOSKELETAL:  No edema; No deformity  SKIN: Warm and dry NEUROLOGIC:  Alert and oriented x 3 PSYCHIATRIC:  Normal affect   ASSESSMENT:    1. Alcoholic liver disease ([place])   2. Hematemesis, presence of nausea not specified   3. History of MI (myocardial infarction)    PLAN:    In order of problems listed above:  1. History of abnormal troponin I which is most likely acute coronary syndrome type II related to hypoxia, hypotension as well as severe anemia.  I do not believe for dealing with traumatic event.  Therefore, the situation anticoagulation is not advisable.  Also review of her varicosis with recent bleeding antiplatelet agent is not advisable as well.  I think it will be decent to continue with small dose of beta-blocker.  In the future I will schedule him to have repeated echocardiogram. 2. Alcoholic liver disease follow-up.GI.  Her H&H is stable.  Last hemoglobin is 10.2. 3. History of MI and again this is acute coronary syndrome from an acute myocardial infarction type II.  In the future we will also check fasting lipid profile however you/office letter will be questionable because of her chronic liver problem.  Luckily it looks like she is not drinking anymore.  She used to be in the [place] but now she moved in here.  She lives with her mother.  She does have a boyfriend who is very supportive.  She is also getting ready to check into the rehab and she will  go there tomorrow.   Medication Adjustments/Labs and Tests Ordered: Current medicines are reviewed at length with the patient today.  Concerns regarding medicines are outlined above.  No orders of the defined types were placed in this encounter.  No orders of the defined types were placed in this encounter.   Signed, Park Liter, MD, [facility]. 05/20/2019 11:59 AM    [place]

## 2019-05-22 LAB — MITOCHONDRIAL ANTIBODIES: Mitochondrial M2 Ab, IgG: <=20 U

## 2019-05-22 LAB — HEPATITIS C ANTIBODY
Hepatitis C Ab: NONREACTIVE
SIGNAL TO CUT-OFF: 0.02 (ref <1.00)

## 2019-05-22 LAB — HEPATITIS A ANTIBODY, TOTAL: Hepatitis A AB,Total: REACTIVE — AB

## 2019-05-22 LAB — ANA: Anti Nuclear Antibody (ANA): NEGATIVE

## 2019-05-22 LAB — HEPATITIS B SURFACE ANTIGEN: Hepatitis B Surface Ag: NONREACTIVE

## 2019-05-22 LAB — IGG: IgG (Immunoglobin G), Serum: 1468 mg/dL (ref 600–1640)

## 2019-05-22 LAB — HEPATITIS B SURFACE ANTIBODY,QUALITATIVE: Hep B S Ab: REACTIVE — AB

## 2019-05-22 LAB — HEPATITIS B CORE ANTIBODY, IGM: Hep B C IgM: NONREACTIVE

## 2019-05-22 LAB — ALPHA-1-ANTITRYPSIN: A-1 Antitrypsin, Ser: 271 mg/dL — ABNORMAL HIGH (ref 83–199)

## 2019-05-22 LAB — AFP TUMOR MARKER: AFP-Tumor Marker: 10.6 ng/mL — ABNORMAL HIGH

## 2019-05-22 LAB — ANTI-SMOOTH MUSCLE ANTIBODY, IGG: Actin (Smooth Muscle) Antibody (IGG): <20 U (ref <20)

## 2019-05-22 LAB — CERULOPLASMIN: Ceruloplasmin: 30 mg/dL (ref 18–53)

## 2019-05-25 NOTE — Progress Notes (Deleted)
     05/25/2019 AARIKA DAVISON NR:[id number] 1981/01/23   Chief Complaint:  History of Present Illness:  ***  Current Medications, Allergies, Past Medical History, Past Surgical History, Family History and Social History were reviewed in [facility] record.   Physical Exam: There were no vitals taken for this visit. General: Well developed, w   ***female in no acute distress. Head: Normocephalic and atraumatic. Eyes: No scleral icterus. Conjunctiva pink . Ears: Normal auditory acuity. Mouth: Dentition intact. No ulcers or lesions.  Lungs: Clear throughout to auscultation. Heart: Regular rate and rhythm, no murmur. Abdomen: Soft, nontender and nondistended. No masses or hepatomegaly. Normal bowel sounds x 4 quadrants.  Rectal: *** Musculoskeletal: Symmetrical with no gross deformities. Extremities: No edema. Neurological: Alert oriented x 4. No focal deficits.  Psychological: Alert and cooperative. Normal mood and affect  Assessment and Recommendations: ***

## 2019-05-26 ENCOUNTER — Ambulatory Visit: Payer: PRIVATE HEALTH INSURANCE | Admitting: Nurse Practitioner

## 2019-05-26 ENCOUNTER — Telehealth: Payer: Self-pay | Admitting: Nurse Practitioner

## 2019-05-26 NOTE — Telephone Encounter (Signed)
Patient's mother called to let us know that they checked in this patient into [facility].

## 2019-05-26 NOTE — Telephone Encounter (Signed)
Hi Frances Williams, patient is enrolled in rehab therefore she was not able to keep her appointment as scheduled today. Can you contact the patient in 2 weeks for an update and to schedule a follow up appointment in our office within 1 week of after she is discharged home. Thx

## 2019-05-26 NOTE — Telephone Encounter (Signed)
Spoke with the mother of the patient. Patient will be in treatment for 30 days. Her intentions are to have follow up labs under the care of the rehab provider. She is a in-patient at [facility] in [place] We discussed options and agreed to schedule the patient a follow up appointment here with the understanding this is fluid and will be changed as needed.

## 2019-06-08 NOTE — Progress Notes (Signed)
Agree with excellent note and plan as outlined RG

## 2019-06-09 ENCOUNTER — Telehealth: Payer: Self-pay | Admitting: Nurse Practitioner

## 2019-06-09 ENCOUNTER — Telehealth: Payer: Self-pay | Admitting: General Surgery

## 2019-06-09 NOTE — Telephone Encounter (Signed)
I contacted [facility]. They are requiring an rx for Xifaxin. It appears to have been discontinued and I am uncertain as to why. She has not started on this medication since it was prescribed back in the beginning of April. Please advise if you would like a new rx and I will send it to [facility].

## 2019-06-09 NOTE — Telephone Encounter (Signed)
[facility] where patient is receiving help at is calling about the Frances Williams- states that for the prior authorization you would need to call [employer]. [phone]) States that's the pharmacy patient uses. Stated if any questions to give them a call back.

## 2019-06-09 NOTE — Telephone Encounter (Signed)
[place] and spoke with [employer]. The pharmacy faxed a request for prior authorization for Xifaxin, with the patients insurnace information blacked out. Frances Williams stated the patient no longer had this insurance and wanted to know if we had any additional insurance on file. Expressed we have the same on file. The patient has not had this rx filled from 05/15/2019.

## 2019-06-09 NOTE — Telephone Encounter (Signed)
I have spoke to patients mother and went over the results with her and what the plan is for the next steps. Patients mother said that she is currently in a treatment center and she will have to call the treatment center to see if she can leave to have MRI completed before she completes her 30 days on May 15th. I have given patients mother our number to call back to have [place] arrange this at at a time that works best for the patient.

## 2019-06-11 MED ORDER — RIFAXIMIN 550 MG PO TABS: 550.0000 mg | ORAL_TABLET | Freq: Two times a day (BID) | ORAL | 1 refills | Status: DC

## 2019-06-11 NOTE — Addendum Note (Signed)
Addended by: Lanny Hurst A on: 06/11/2019 04:21 PM   Modules accepted: Orders

## 2019-06-11 NOTE — Telephone Encounter (Signed)
Sent xifaxin to [employer] but they have required Prior authorizaton. Spoke with Chrisna at [facility], They require [place] to send this rx to the patients insurance and then receive a PA for them to then dispense to the rehabilitation Center. I explained to her we usually send to [facility]. Chrisna stated that would be fine.

## 2019-06-11 NOTE — Telephone Encounter (Signed)
OK to send RX for Xifaxan 550mg  one po bid # 60 with 1 additional refill. Xifaxan is for hepatic encephalopathy. Thx

## 2019-06-11 NOTE — Telephone Encounter (Signed)
At the time of the patient's office visit, her significant other stated they would pay out of pocket for the Xifaxan if needed, so that is still an option but not preferable as Xifaxan is expensive. However, there is also a Xifaxan assistance program which she might qualify for. Please check with the other CMAs to see if they know how to enroll patient into the assistance program. Note, the patient is currently admitted to inpatient rehab.

## 2019-06-12 ENCOUNTER — Other Ambulatory Visit: Payer: Self-pay

## 2019-06-12 ENCOUNTER — Telehealth: Payer: Self-pay | Admitting: Nurse Practitioner

## 2019-06-12 ENCOUNTER — Telehealth: Payer: Self-pay

## 2019-06-12 DIAGNOSIS — K7682 Hepatic encephalopathy: Secondary | ICD-10-CM

## 2019-06-12 DIAGNOSIS — K7290 Hepatic failure, unspecified without coma: Secondary | ICD-10-CM

## 2019-06-12 DIAGNOSIS — K760 Fatty (change of) liver, not elsewhere classified: Secondary | ICD-10-CM

## 2019-06-12 DIAGNOSIS — K709 Alcoholic liver disease, unspecified: Secondary | ICD-10-CM

## 2019-06-12 NOTE — Telephone Encounter (Signed)
Beth, that is great. Yes, Pls send her to the lab for a CBC, BMP, hepatic panel, INR and ammonia level prior to her follow up appt. Thx

## 2019-06-12 NOTE — Telephone Encounter (Signed)
Advised of this. Orders placed. Will get back with her on the Xifaxan. Sending a message to Olivia Mackie about the discharge date.

## 2019-06-12 NOTE — Telephone Encounter (Signed)
Spoke with mother, Ms. Prusinski. Patient is scheduled for discharge on 06/19/19. The MRI of the liver will be on 06/24/19 at [facility]. Arrive at 12:30pm. Fast for 4 hours prior to the appointment. Do you need any labs prior to her appointment with you for follow up on 06/30/19?

## 2019-06-12 NOTE — Telephone Encounter (Signed)
It has not been approved yet. I will contact [employer] tomorrow if nothing comes by tonite

## 2019-06-12 NOTE — Telephone Encounter (Signed)
Pt's mother called to schedule MRI liver.

## 2019-06-12 NOTE — Telephone Encounter (Signed)
Patient will be discharge from rehab on 06/19/19.  She will have follow up labs and an MRI of the liver on 06/07/19. Her follow up appointment with Jaclyn Shaggy is on 06/30/19. Was the Xifaxan approved?

## 2019-06-12 NOTE — Telephone Encounter (Signed)
Thank you Frances Williams. She appreciates you working on this.

## 2019-06-16 NOTE — Telephone Encounter (Signed)
[facility] senet over a notification that they were reaching out to the patient today regarding other insurance information.

## 2019-06-24 ENCOUNTER — Other Ambulatory Visit (INDEPENDENT_AMBULATORY_CARE_PROVIDER_SITE_OTHER): Payer: PRIVATE HEALTH INSURANCE

## 2019-06-24 ENCOUNTER — Other Ambulatory Visit: Payer: Self-pay

## 2019-06-24 ENCOUNTER — Ambulatory Visit (HOSPITAL_COMMUNITY)
Admission: RE | Admit: 2019-06-24 | Discharge: 2019-06-24 | Disposition: A | Discharge status: Home / Self Care | Payer: 59 | Source: Ambulatory Visit | Attending: Nurse Practitioner | Admitting: Nurse Practitioner

## 2019-06-24 DIAGNOSIS — K7682 Hepatic encephalopathy: Secondary | ICD-10-CM

## 2019-06-24 DIAGNOSIS — K709 Alcoholic liver disease, unspecified: Secondary | ICD-10-CM

## 2019-06-24 DIAGNOSIS — K760 Fatty (change of) liver, not elsewhere classified: Secondary | ICD-10-CM | POA: Diagnosis not present

## 2019-06-24 DIAGNOSIS — K7290 Hepatic failure, unspecified without coma: Secondary | ICD-10-CM | POA: Diagnosis not present

## 2019-06-24 LAB — BASIC METABOLIC PANEL
BUN: 10 mg/dL (ref 6–23)
CO2: 26 mEq/L (ref 19–32)
Calcium: 10.1 mg/dL (ref 8.4–10.5)
Chloride: 98 mEq/L (ref 96–112)
Creatinine, Ser: 0.84 mg/dL (ref 0.40–1.20)
GFR: 76.43 mL/min (ref >60.00)
Glucose, Bld: 98 mg/dL (ref 70–99)
Potassium: 4 mEq/L (ref 3.5–5.1)
Sodium: 134 mEq/L — ABNORMAL LOW (ref 135–145)

## 2019-06-24 LAB — HEPATIC FUNCTION PANEL
ALT: 32 U/L (ref 0–35)
AST: 57 U/L — ABNORMAL HIGH (ref 0–37)
Albumin: 4.3 g/dL (ref 3.5–5.2)
Alkaline Phosphatase: 102 U/L (ref 39–117)
Bilirubin, Direct: 0.7 mg/dL — ABNORMAL HIGH (ref 0.0–0.3)
Total Bilirubin: 3.2 mg/dL — ABNORMAL HIGH (ref 0.2–1.2)
Total Protein: 8.4 g/dL — ABNORMAL HIGH (ref 6.0–8.3)

## 2019-06-24 LAB — CBC WITH DIFFERENTIAL/PLATELET
Basophils Absolute: 0.1 10*3/uL (ref 0.0–0.1)
Basophils Relative: 1.7 % (ref 0.0–3.0)
Eosinophils Absolute: 0.2 10*3/uL (ref 0.0–0.7)
Eosinophils Relative: 2.3 % (ref 0.0–5.0)
HCT: 33.2 % — ABNORMAL LOW (ref 36.0–46.0)
Hemoglobin: 11.1 g/dL — ABNORMAL LOW (ref 12.0–15.0)
Lymphocytes Relative: 31.7 % (ref 12.0–46.0)
Lymphs Abs: 2.5 10*3/uL (ref 0.7–4.0)
MCHC: 33.5 g/dL (ref 30.0–36.0)
MCV: 87.4 fl (ref 78.0–100.0)
Monocytes Absolute: 0.7 10*3/uL (ref 0.1–1.0)
Monocytes Relative: 8.7 % (ref 3.0–12.0)
Neutro Abs: 4.5 10*3/uL (ref 1.4–7.7)
Neutrophils Relative %: 55.6 % (ref 43.0–77.0)
Platelets: 169 10*3/uL (ref 150.0–400.0)
RBC: 3.8 Mil/uL — ABNORMAL LOW (ref 3.87–5.11)
RDW: 19.4 % — ABNORMAL HIGH (ref 11.5–15.5)
WBC: 8 10*3/uL (ref 4.0–10.5)

## 2019-06-24 LAB — PROTIME-INR
INR: 1.5 ratio — ABNORMAL HIGH (ref 0.8–1.0)
Prothrombin Time: 16.2 s — ABNORMAL HIGH (ref 9.6–13.1)

## 2019-06-24 LAB — AMMONIA: Ammonia: 41 umol/L — ABNORMAL HIGH (ref 11–35)

## 2019-06-24 MED ORDER — GADOBUTROL 1 MMOL/ML IV SOLN
8.0000 mL | Freq: Once | INTRAVENOUS | Status: AC | PRN
  Administered 2019-06-24: 8 mL via INTRAVENOUS

## 2019-06-30 ENCOUNTER — Encounter: Payer: Self-pay | Admitting: Nurse Practitioner

## 2019-06-30 ENCOUNTER — Ambulatory Visit (INDEPENDENT_AMBULATORY_CARE_PROVIDER_SITE_OTHER): Payer: PRIVATE HEALTH INSURANCE | Admitting: Nurse Practitioner

## 2019-06-30 VITALS — BP 96/52 | HR 65 | Ht 69.0 in | Wt 160.0 lb

## 2019-06-30 DIAGNOSIS — F1010 Alcohol abuse, uncomplicated: Secondary | ICD-10-CM

## 2019-06-30 DIAGNOSIS — K7030 Alcoholic cirrhosis of liver without ascites: Secondary | ICD-10-CM

## 2019-06-30 MED ORDER — FUROSEMIDE 20 MG PO TABS
20.0000 mg | ORAL_TABLET | Freq: Every day | ORAL | 1 refills | Status: DC
Start: 2019-06-30 — End: 2019-07-09

## 2019-06-30 NOTE — Patient Instructions (Addendum)
If you are age 37 or older, your body mass index should be between 23-30. Your Body mass index is 23.63 kg/m. If this is out of the aforementioned range listed, please consider follow up with your Primary Care Provider.  If you are age 60 or younger, your body mass index should be between 19-25. Your Body mass index is 23.63 kg/m. If this is out of the aformentioned range listed, please consider follow up with your Primary Care Provider.   We have sent the following medications to your pharmacy for you to pick up at your convenience: Please reduce your Furosemide to 20 mg daily. refill sent to pharmacy  You will need to have your labs done two days prior to your biopsy. Please return to our lab on the basement level.   You are to have a Transjugular liver biopsy at [facility] on 07/10/2019 at [place] must be empty nothing to eat or drink after midnight. Interventional Radiology will call you the day prior to your Biopsy.    Due to recent changes in healthcare laws, you may see the results of your imaging and laboratory studies on MyChart before your provider has had a chance to review them.  We understand that in some cases there may be results that are confusing or concerning to you. Not all laboratory results come back in the same time frame and the provider may be waiting for multiple results in order to interpret others.  Please give [place] 48 hours in order for your provider to thoroughly review all the results before contacting the office for clarification of your results.   Thank you for choosing [facility] Noralyn Pick, CRNP

## 2019-06-30 NOTE — Progress Notes (Signed)
06/30/2019 Frances Williams [id number] [birth year]   Chief Complaint: alcoholic liver disease, follow up after completing alcohol rehab   History of Present Illness:  Frances Williams is a 39 year old female with a past medical history of hypertension and alcoholism. 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 I initially saw the patient in office on 05/15/2019 following a complicated hospitalization in [place] March 05/01/2019 - 05/12/2019 secondary to having hematemesis/UGI and acute anemia in setting of newly diagnosed EtOH liver disease/hepatomegaly/acites, septic shock, coagulopathy, respiratory failure secondary to aspiration pneumonia, acute coronary syndrome and AKI.  Refer to her initial consult for a comprehensive review of her history and hospitalization records.   She was evaluated by cardiologist Dr. Agustin Cree on 05/20/2019. Dr. Agustin Cree reviewed her hospital course and he verified she did have acute coronary syndrome/acute MI type II related to severe anemia and hypotension. Anticoagulation was not advised. She was instructed tom continue low dose Metoprolol. A repeat ECHO to be scheduled in the near future.   She was admitted to [facility] on 05/21/2019 therefore she was not able to follow up in our office until today. She appears remarkably well at this time. She remains alcohol free since 04/30/2019. She completed 30 days of rehab which she assess was successful. She does not have any desire to drink alcohol. She did not wish to take Antabuse. She is living with her parents who are supportive. She presents today accompanied by her mother. She reports feeling well. No nausea or vomiting. No further hematemesis. No heartburn. She remains on Pantoprazole 23m QD. No upper or lower abdominal pain. She is urinating normal yellow urine. She is passing 3 soft brown BMs most days. No rectal bleeding or melena. No confusion,  mentation has remained clear. She remains on Lactulose 249mm bid. She was unable to obtain Xifaxan due to the cost. No further ascites or lower extremity edema. Weight today 160 lbs. Weight 183 lbs on 05/15/2019.   Her most recent labs 06/24/2019 show WBC 8.0. Hg 11.1 up from 10.3. PLT 169 down from 250. Cr. 0.84 up from 0.63. T.  Bili remains elevated 3.2.  Alk phos 102. AST 57. ALT ALT 31. INR 1.5. up from 1.4. Ammonia 41.   Labs 05/16/2019: Iron 31. Ferritin 125. Vitamin B12 1131. Ceruloplasmin 30. IgG 1468. A1AT 271. TSH 3.87. Ammonia 105. AFP 10.6.  ANA negative. AMA < 20. SMA < 20. Hepatitis A total ab reactive. Hepatitis B surface antigen nonreactive. Hepatitis  B surface antibody positive. Hepatitis B core IgM nonreactive. Hepatitis C antibody negative.   She underwent an abdominal MRI 06/24/2019 which showed lobular hepatic contours suggestive of cirrhosis, no definitive  mass, marginal areas of abnormal signal and periportal changes concerning for possible infiltrative process such as amyloidosis, subtle area of hypointensity along the gallbladder fossa and splenomegaly. No ascites. Dr. GuLyndel Safeeviewed the abdominal MRI report with recommendations to schedule the patient for a transjugular liver biopsy  CBC Latest Ref Rng & Units 06/24/2019 05/16/2019  WBC 4.0 - 10.5 K/uL 8.0 9.1  Hemoglobin 12.0 - 15.0 g/dL 11.1(L) 10.3(L)  Hematocrit 36.0 - 46.0 % 33.2(L) 31.2(L)  Platelets 150.0 - 400.0 K/uL 169.0 250.0   CMP Latest Ref Rng & Units 06/24/2019 05/16/2019  Glucose 70 - 99 mg/dL 98 115(H)  BUN 6 - 23 mg/dL 10 10  Creatinine 0.40 - 1.20 mg/dL 0.84 0.63  Sodium 135 - 145 mEq/L 134(L) 137  Potassium 3.5 - 5.1 mEq/L 4.0 3.9  Chloride 96 - 112 mEq/L 98 102  CO2 19 - 32 mEq/L 26 24  Calcium 8.4 - 10.5 mg/dL 10.1 9.9  Total Protein 6.0 - 8.3 g/dL 8.4(H) 7.7  Total Bilirubin 0.2 - 1.2 mg/dL 3.2(H) 3.3(H)  Alkaline Phos 39 - 117 U/L 102 103  AST 0 - 37 U/L 57(H) 92(H)  ALT 0 - 35 U/L 32 52(H)    Abdominal MRI with contrast 06/24/2019: 1. Heterogeneous hepatic enhancement with unusual pattern of periportal and then peripheral hypointensity that normalizes on later phases. Scalloped low signal at the margin on venous phase is somewhat unusual but much of these findings normalize on delayed phase imaging. Findings may be due to underlying cirrhosis or acute hepatic inflammation. Correlate with risk factors for liver disease. 2. The the marginal areas of abnormal signal and periportal changes also raise the question of infiltrative liver process such as amyloidosis; however, there is no secondary finding to support this diagnosis. These areas do not have the typical appearance of an infiltrative neoplasm, this is not suggested by imaging on the current study. Ultimately elastography and or biopsy may be helpful. Three-month follow-up with MRI could be considered as well. 3. Subtle area of hypointensity with rounded margins seen only on venous phase imaging, not seen on later phase imaging along the gallbladder fossa is of uncertain significance perhaps related to the above process. In a patient with known liver disease this would be characterized as LI-RADS category 3. It appears more geographic on coronal reconstructed images. Suggest attention on 3 month follow-up to this area. 4. Splenomegaly.  No ascites.  Current Outpatient Medications on File Prior to Visit  Medication Sig Dispense Refill  . B Complex Vitamins (B COMPLEX-B12 PO) Take by mouth daily.    Marland Kitchen escitalopram (LEXAPRO) 20 MG tablet Take 20 mg by mouth daily.    Marland Kitchen FOLIC ACID PO Take by mouth daily.    Marland Kitchen lactulose (CHRONULAC) 10 GM/15ML solution Take 30 g by mouth 2 (two) times daily.     . metoprolol tartrate (LOPRESSOR) 25 MG tablet Take 12.5 mg by mouth 2 (two) times daily.    . Multiple Vitamins-Minerals (ONE-A-DAY WOMENS PO) Take by mouth daily.    . pantoprazole (PROTONIX) 40 MG tablet Take 40 mg by mouth 2  (two) times daily.    Marland Kitchen spironolactone (ALDACTONE) 50 MG tablet Take 50 mg by mouth daily.    . rifaximin (XIFAXAN) 550 MG TABS tablet Take 1 tablet (550 mg total) by mouth 2 (two) times daily. (Patient not taking: Reported on 06/30/2019) 60 tablet 1   No current facility-administered medications on file prior to visit.    No Known Allergies   Current Medications, Allergies, Past Medical History, Past Surgical History, Family History and Social History were reviewed in [employer] record.   Physical Exam: BP (!) 96/52   Pulse 65   Ht 5' 9"  (1.753 m)   Wt 160 lb (72.6 kg)   LMP 06/18/2019 (Approximate)   SpO2 99%   BMI 23.63 kg/m  General: Well developed 39 year old female in no acute distress. Head: Normocephalic and atraumatic. Eyes: No scleral icterus. Conjunctiva pink . Ears: Normal auditory acuity. Mouth: Dentition intact. No ulcers or lesions.  Lungs: Clear throughout to auscultation. Heart: Regular rate and rhythm, no murmur. Abdomen: Soft, nondistended. Nontender. Left liver lobe enlarged and palpable, right liver border palpated 1  cm below the costal margin with deep inspiration. No ascites. Normal bowel sounds x 4 quadrants.  Rectal: Deferred.  Musculoskeletal: Symmetrical with no gross deformities. Extremities: No edema. Neurological: Alert oriented x 4. No focal deficits. No asterixis.  Psychological: Alert and cooperative. Normal mood and affect. Skin: No jaundice.   Assessment and Recommendations: 56. 39 year old female with Etoh liver disease, most likely has cirrhosis. Hepatomegaly and splenomegaly. No further ascites on Spironolactone 28m  daily and Furosemide 442mdaily. Elevated AFP. Abnormal abdominal MRI.  -Transjugular liver biopsy to rule out infiltrative disease. Transjugular liver biopsy benefits and risks discussed including risk of perforation,  bleeding/hemorrhage,  hematoma -Repeat CBC, CMP and PT/INR prior to liver biopsy   -Reduce Lasix 2078maily due to asymptomatic hypotension (BP 96/52).  Ascites and LE edema resolved. Weight down 20lbs. Further diuretic dose reduction may be required if she remains hypotensive.  -Continue 2gm low sodium diet -Continue Lactulose 84m70m bid for now. Hold off on Xifaxan which was not affordable and no signs of hepatic encephalopathy. Ammonia level normalizing.  -Continue multi vitamin, Thiamine 100m573UKly and folic acid 1mg 64mly  -Patient to follow up with Dr. GuptaLyndel Safer liver biopsy completed   2. UGI bleed/hematemesis. EGD 3/26 showed esophageal ulcerations and nonbleeding duodenal bulb ulcers without evidence of esophageal varices.  No further hematemesis. -Continue Pantoprazole 40mg 43m? Eventual repeat EGD to assess healing of esophageal ulcers  3.  Acute coronary syndrome/acute MI type II related to severe anemia and hypotension. Anticoagulation not advisable.  -Continue follow up with Dr. KraskoMarlin Canaryheduled, Metoprolol dose may need to be reduced if her BP remains low.

## 2019-07-07 NOTE — Progress Notes (Signed)
Agree with the plan RG 

## 2019-07-08 ENCOUNTER — Other Ambulatory Visit (INDEPENDENT_AMBULATORY_CARE_PROVIDER_SITE_OTHER): Payer: PRIVATE HEALTH INSURANCE

## 2019-07-08 ENCOUNTER — Telehealth: Payer: Self-pay | Admitting: Nurse Practitioner

## 2019-07-08 ENCOUNTER — Encounter: Payer: Self-pay | Admitting: Nurse Practitioner

## 2019-07-08 DIAGNOSIS — K7030 Alcoholic cirrhosis of liver without ascites: Secondary | ICD-10-CM | POA: Diagnosis not present

## 2019-07-08 DIAGNOSIS — F1010 Alcohol abuse, uncomplicated: Secondary | ICD-10-CM

## 2019-07-08 LAB — CBC
HCT: 31.1 % — ABNORMAL LOW (ref 36.0–46.0)
Hemoglobin: 10.5 g/dL — ABNORMAL LOW (ref 12.0–15.0)
MCHC: 33.8 g/dL (ref 30.0–36.0)
MCV: 87 fl (ref 78.0–100.0)
Platelets: 167 10*3/uL (ref 150.0–400.0)
RBC: 3.58 Mil/uL — ABNORMAL LOW (ref 3.87–5.11)
RDW: 19.9 % — ABNORMAL HIGH (ref 11.5–15.5)
WBC: 8.4 10*3/uL (ref 4.0–10.5)

## 2019-07-08 LAB — COMPREHENSIVE METABOLIC PANEL
ALT: 29 U/L (ref 0–35)
AST: 51 U/L — ABNORMAL HIGH (ref 0–37)
Albumin: 4.2 g/dL (ref 3.5–5.2)
Alkaline Phosphatase: 98 U/L (ref 39–117)
BUN: 11 mg/dL (ref 6–23)
CO2: 23 mEq/L (ref 19–32)
Calcium: 10 mg/dL (ref 8.4–10.5)
Chloride: 97 mEq/L (ref 96–112)
Creatinine, Ser: 0.78 mg/dL (ref 0.40–1.20)
GFR: 83.24 mL/min (ref >60.00)
Glucose, Bld: 91 mg/dL (ref 70–99)
Potassium: 4.1 mEq/L (ref 3.5–5.1)
Sodium: 132 mEq/L — ABNORMAL LOW (ref 135–145)
Total Bilirubin: 2.8 mg/dL — ABNORMAL HIGH (ref 0.2–1.2)
Total Protein: 8 g/dL (ref 6.0–8.3)

## 2019-07-08 LAB — PROTIME-INR
INR: 1.5 ratio — ABNORMAL HIGH (ref 0.8–1.0)
Prothrombin Time: 16.3 s — ABNORMAL HIGH (ref 9.6–13.1)

## 2019-07-08 NOTE — Telephone Encounter (Signed)
ERROR

## 2019-07-08 NOTE — Telephone Encounter (Signed)
Do you want her on Xifaxan?

## 2019-07-08 NOTE — Telephone Encounter (Signed)
patient is requesting lasix and xifixan refill to be sent to [facility]. Also, pt is requesting to speak to a nurse. She states that [place] told her to continue taking the meds that were prescribed for when she was in the hospital. She is needing refills on those as well.

## 2019-07-09 ENCOUNTER — Other Ambulatory Visit: Payer: Self-pay | Admitting: Radiology

## 2019-07-09 ENCOUNTER — Other Ambulatory Visit: Payer: Self-pay | Admitting: Cardiology

## 2019-07-09 ENCOUNTER — Other Ambulatory Visit: Payer: Self-pay | Admitting: Nurse Practitioner

## 2019-07-09 ENCOUNTER — Other Ambulatory Visit: Payer: Self-pay

## 2019-07-09 DIAGNOSIS — K7030 Alcoholic cirrhosis of liver without ascites: Secondary | ICD-10-CM

## 2019-07-09 MED ORDER — PANTOPRAZOLE SODIUM 40 MG PO TBEC: 40.0000 mg | DELAYED_RELEASE_TABLET | Freq: Two times a day (BID) | ORAL | 3 refills | Status: DC

## 2019-07-09 MED ORDER — RIFAXIMIN 550 MG PO TABS: 550.0000 mg | ORAL_TABLET | Freq: Two times a day (BID) | ORAL | 1 refills | Status: DC

## 2019-07-09 MED ORDER — FUROSEMIDE 20 MG PO TABS: 20.0000 mg | ORAL_TABLET | Freq: Every day | ORAL | 1 refills | Status: DC

## 2019-07-09 MED ORDER — LACTULOSE 10 GM/15ML PO SOLN: 20.0000 g | Freq: Two times a day (BID) | ORAL | 1 refills | Status: DC

## 2019-07-09 MED ORDER — FOLIC ACID 1 MG PO TABS: 1.0000 mg | ORAL_TABLET | Freq: Every day | ORAL | 0 refills | Status: DC

## 2019-07-09 MED ORDER — SPIRONOLACTONE 50 MG PO TABS: 50.0000 mg | ORAL_TABLET | Freq: Every day | ORAL | 1 refills | Status: DC

## 2019-07-09 NOTE — Addendum Note (Signed)
Addended by: Aleatha Borer on: 07/09/2019 04:18 PM   Modules accepted: Orders

## 2019-07-09 NOTE — Telephone Encounter (Signed)
Thank you. I have also spoken with Estill Bamberg, CMA. I refilled the pantoprazole. Spoke with the pharmacist. They will re-direct the refill request for Metoprolol to Dr Agustin Cree of [facility]. The patient has been given the names of several PCP offices. She will start calling today to find a primary care home. She understands that is where she will get Lexapro from. She has decided to not start the Xifaxan. She understands that if the need for it arises in the future, there is a patient assistance program to possibly help with the cost. She has had labs. Her liver biopsy is tomorrow per the patient.

## 2019-07-09 NOTE — Telephone Encounter (Signed)
Hi Frances Williams, I refilled Furosemide 20mg  Qd, Spironolactone 50mg  QD, Xifaxan 550mg  bid,  Lactulose 20gm bid and Folate acid. She was taking a Vitamin B supplement with thiamine so I did not reorder her thiamine. Please verify with patient what dose of thiamine her supplement provides. She should be on thiamine 100mg  po QD. If her supplement is less than 100mg  thiamine daily let me know. Pt is due to have labs done prior to her liver biopsy, based on those lab results her diuretics might need to be reduced. I will contact patient when lab results received. Thanks.

## 2019-07-10 ENCOUNTER — Ambulatory Visit (HOSPITAL_COMMUNITY)
Admission: RE | Admit: 2019-07-10 | Discharge: 2019-07-10 | Disposition: A | Discharge status: Home / Self Care | Payer: 59 | Source: Ambulatory Visit | Attending: Nurse Practitioner | Admitting: Nurse Practitioner

## 2019-07-10 ENCOUNTER — Other Ambulatory Visit: Payer: Self-pay

## 2019-07-10 ENCOUNTER — Ambulatory Visit (HOSPITAL_COMMUNITY)
Admission: RE | Admit: 2019-07-10 | Discharge: 2019-07-10 | Disposition: A | Discharge status: Home / Self Care | Payer: 59 | Source: Ambulatory Visit

## 2019-07-10 ENCOUNTER — Encounter (HOSPITAL_COMMUNITY): Payer: Self-pay

## 2019-07-10 ENCOUNTER — Other Ambulatory Visit: Payer: Self-pay | Admitting: Nurse Practitioner

## 2019-07-10 DIAGNOSIS — F1010 Alcohol abuse, uncomplicated: Secondary | ICD-10-CM | POA: Diagnosis not present

## 2019-07-10 DIAGNOSIS — Z87891 Personal history of nicotine dependence: Secondary | ICD-10-CM | POA: Diagnosis not present

## 2019-07-10 DIAGNOSIS — Z79899 Other long term (current) drug therapy: Secondary | ICD-10-CM | POA: Insufficient documentation

## 2019-07-10 DIAGNOSIS — I129 Hypertensive chronic kidney disease with stage 1 through stage 4 chronic kidney disease, or unspecified chronic kidney disease: Secondary | ICD-10-CM | POA: Insufficient documentation

## 2019-07-10 DIAGNOSIS — K7030 Alcoholic cirrhosis of liver without ascites: Secondary | ICD-10-CM | POA: Insufficient documentation

## 2019-07-10 DIAGNOSIS — N189 Chronic kidney disease, unspecified: Secondary | ICD-10-CM | POA: Diagnosis not present

## 2019-07-10 HISTORY — PX: IR US GUIDE VASC ACCESS RIGHT: IMG2390

## 2019-07-10 HISTORY — PX: IR VENOGRAM HEPATIC W HEMODYNAMIC EVALUATION: IMG692

## 2019-07-10 HISTORY — PX: IR TRANSCATHETER BX: IMG713

## 2019-07-10 LAB — COMPREHENSIVE METABOLIC PANEL
ALT: 37 U/L (ref 0–44)
AST: 67 U/L — ABNORMAL HIGH (ref 15–41)
Albumin: 4.1 g/dL (ref 3.5–5.0)
Alkaline Phosphatase: 97 U/L (ref 38–126)
Anion gap: 11 (ref 5–15)
BUN: 11 mg/dL (ref 6–20)
CO2: 21 mmol/L — ABNORMAL LOW (ref 22–32)
Calcium: 9.5 mg/dL (ref 8.9–10.3)
Chloride: 101 mmol/L (ref 98–111)
Creatinine, Ser: 0.78 mg/dL (ref 0.44–1.00)
GFR calc Af Amer: >60 mL/min (ref >60)
GFR calc non Af Amer: >60 mL/min (ref >60)
Glucose, Bld: 93 mg/dL (ref 70–99)
Potassium: 3.9 mmol/L (ref 3.5–5.1)
Sodium: 133 mmol/L — ABNORMAL LOW (ref 135–145)
Total Bilirubin: 3.1 mg/dL — ABNORMAL HIGH (ref 0.3–1.2)
Total Protein: 7.9 g/dL (ref 6.5–8.1)

## 2019-07-10 LAB — CBC WITH DIFFERENTIAL/PLATELET
Abs Immature Granulocytes: 0.03 10*3/uL (ref 0.00–0.07)
Basophils Absolute: 0.1 10*3/uL (ref 0.0–0.1)
Basophils Relative: 1 %
Eosinophils Absolute: 0.2 10*3/uL (ref 0.0–0.5)
Eosinophils Relative: 2 %
HCT: 34.4 % — ABNORMAL LOW (ref 36.0–46.0)
Hemoglobin: 11.1 g/dL — ABNORMAL LOW (ref 12.0–15.0)
Immature Granulocytes: 0 %
Lymphocytes Relative: 33 %
Lymphs Abs: 2.4 10*3/uL (ref 0.7–4.0)
MCH: 28.8 pg (ref 26.0–34.0)
MCHC: 32.3 g/dL (ref 30.0–36.0)
MCV: 89.1 fL (ref 80.0–100.0)
Monocytes Absolute: 0.5 10*3/uL (ref 0.1–1.0)
Monocytes Relative: 7 %
Neutro Abs: 4.2 10*3/uL (ref 1.7–7.7)
Neutrophils Relative %: 57 %
Platelets: 167 10*3/uL (ref 150–400)
RBC: 3.86 MIL/uL — ABNORMAL LOW (ref 3.87–5.11)
RDW: 19 % — ABNORMAL HIGH (ref 11.5–15.5)
WBC: 7.4 10*3/uL (ref 4.0–10.5)
nRBC: 0 % (ref 0.0–0.2)

## 2019-07-10 LAB — HCG, SERUM, QUALITATIVE: Preg, Serum: NEGATIVE

## 2019-07-10 MED ORDER — LIDOCAINE HCL 1 % IJ SOLN
INTRAMUSCULAR | Status: AC
  Filled 2019-07-10: qty 20

## 2019-07-10 MED ORDER — FENTANYL CITRATE (PF) 100 MCG/2ML IJ SOLN
INTRAMUSCULAR | Status: AC | PRN
  Administered 2019-07-10 (×2): 50 ug via INTRAVENOUS

## 2019-07-10 MED ORDER — IOHEXOL 300 MG/ML  SOLN
50.0000 mL | Freq: Once | INTRAMUSCULAR | Status: AC | PRN
  Administered 2019-07-10: 25 mL via INTRAVENOUS

## 2019-07-10 MED ORDER — MIDAZOLAM HCL 2 MG/2ML IJ SOLN
INTRAMUSCULAR | Status: AC | PRN
  Administered 2019-07-10 (×2): 1 mg via INTRAVENOUS

## 2019-07-10 MED ORDER — DIPHENHYDRAMINE HCL 50 MG/ML IJ SOLN
INTRAMUSCULAR | Status: AC
  Filled 2019-07-10: qty 1

## 2019-07-10 MED ORDER — FENTANYL CITRATE (PF) 100 MCG/2ML IJ SOLN
INTRAMUSCULAR | Status: AC
  Filled 2019-07-10: qty 4

## 2019-07-10 MED ORDER — SODIUM CHLORIDE 0.9 % IV SOLN: INTRAVENOUS | Status: DC

## 2019-07-10 MED ORDER — MIDAZOLAM HCL 2 MG/2ML IJ SOLN
INTRAMUSCULAR | Status: AC
  Filled 2019-07-10: qty 4

## 2019-07-10 MED ORDER — LIDOCAINE HCL (PF) 1 % IJ SOLN
INTRAMUSCULAR | Status: AC | PRN
  Administered 2019-07-10: 5 mL via INTRADERMAL

## 2019-07-10 NOTE — H&P (Signed)
Chief Complaint: Patient was seen in consultation today for elevated LFTs/transjugular liver biopsy.  Referring Physician(s): Noralyn Pick  Supervising Physician: Markus Daft  Patient Status: [facility] - Out-pt  History of Present Illness: Frances Williams is a 39 y.o. female with a past medical history of hypertension, seizure x1 secondary to benign brain tumor s/p resection in [id number], alcoholic liver disease, CKD, and prior alcohol abuse. She recently completed a 30 day alcohol rehab in 06/2019. Following this, she was referred to GI for management of elevated LFTs who ordered appropriate imaging scans for further evaluation.  MR liver 06/24/2019: 1. Heterogeneous hepatic enhancement with unusual pattern of periportal and then peripheral hypointensity that normalizes on later phases. Scalloped low signal at the margin on venous phase is somewhat unusual but much of these findings normalize on delayed phase imaging. Findings may be due to underlying cirrhosis or acute hepatic inflammation. Correlate with risk factors for liver disease. 2. The the marginal areas of abnormal signal and periportal changes also raise the question of infiltrative liver process such as amyloidosis; however, there is no secondary finding to support this diagnosis. These areas do not have the typical appearance of an infiltrative neoplasm, this is not suggested by imaging on the current study. Ultimately elastography and or biopsy may be helpful. Three-month follow-up with MRI could be considered as well. 3. Subtle area of hypointensity with rounded margins seen only on venous phase imaging, not seen on later phase imaging along the gallbladder fossa is of uncertain significance perhaps related to the above process. In a patient with known liver disease this would be characterized as LI-RADS category 3. It appears more geographic on coronal reconstructed images. Suggest attention on 3 month follow-up to this area. 4.  Splenomegaly.  No ascites.  IR requested by Carl Best, NP for possible image-guided transjugular liver biopsy. Patient awake and alert laying in bed with no complaints at this time. Denies fever, chills, chest pain, dyspnea, abdominal pain, or headache.   Past Medical History:  Diagnosis Date  . Alcohol abuse   . Alcoholic liver disease ([place]) 05/16/2019  . Brain tumor ([place])    BENIGN REMOVAL JULY 2001  . Chronic kidney disease   . Hematemesis 05/16/2019  . Hepatomegaly 05/16/2019  . Hypertension   . Seizure [facility])     Past Surgical History:  Procedure Laterality Date  . BRAIN SURGERY     2001 on temporal lobe  . ESOPHAGOGASTRODUODENOSCOPY  05/02/2019   [facility] [place]    Allergies: Patient has no known allergies.  Medications: Prior to Admission medications   Medication Sig Start Date End Date Taking? Authorizing Provider  B Complex Vitamins (B COMPLEX-B12 PO) Take by mouth daily.   Yes [provider]  escitalopram (LEXAPRO) 20 MG tablet Take 20 mg by mouth daily.   Yes [provider]  folic acid (FOLVITE) 1 MG tablet Take 1 tablet (1 mg total) by mouth daily. 07/09/19  Yes Noralyn Pick, NP  furosemide (LASIX) 20 MG tablet Take 1 tablet (20 mg total) by mouth daily. 07/09/19  Yes Noralyn Pick, NP  lactulose (CHRONULAC) 10 GM/15ML solution Take 30 mLs (20 g total) by mouth 2 (two) times daily. 07/09/19  Yes Noralyn Pick, NP  metoprolol tartrate (LOPRESSOR) 25 MG tablet TAKE 1/2 TABLET TWICE DAILY 07/09/19  Yes Park Liter, MD  Multiple Vitamins-Minerals (ONE-A-DAY WOMENS PO) Take by mouth daily.   Yes [provider]  pantoprazole (PROTONIX) 40 MG tablet  Take 1 tablet (40 mg total) by mouth 2 (two) times daily. 07/09/19  Yes Noralyn Pick, NP  spironolactone (ALDACTONE) 50 MG tablet Take 1 tablet (50 mg total) by mouth daily. 07/09/19  Yes Noralyn Pick, NP  rifaximin (XIFAXAN)  550 MG TABS tablet Take 1 tablet (550 mg total) by mouth 2 (two) times daily. 07/09/19   Noralyn Pick, NP     Family History  Problem Relation Age of Onset  . Breast cancer Mother 12  . Ovarian cancer Paternal Grandmother   . Colon cancer Neg Hx   . Esophageal cancer Neg Hx     Social History   Socioeconomic History  . Marital status: Single    Spouse name: Not on file  . Number of children: Not on file  . Years of education: Not on file  . Highest education level: Not on file  Occupational History  . Not on file  Tobacco Use  . Smoking status: Former Research scientist (life sciences)  . Smokeless tobacco: Never Used  . Tobacco comment: quit 05/01/2019  Substance and Sexual Activity  . Alcohol use: Not Currently    Comment: quit 05/01/2019  . Drug use: Not Currently    Types: "Crack" cocaine, Marijuana  . Sexual activity: Not on file  Other Topics Concern  . Not on file  Social History Narrative  . Not on file   Social Determinants of Health   Financial Resource Strain:   . Difficulty of Paying Living Expenses:   Food Insecurity:   . Worried About Charity fundraiser in the Last Year:   . Arboriculturist in the Last Year:   Transportation Needs:   . Film/video editor (Medical):   Marland Kitchen Lack of Transportation (Non-Medical):   Physical Activity:   . Days of Exercise per Week:   . Minutes of Exercise per Session:   Stress:   . Feeling of Stress :   Social Connections:   . Frequency of Communication with Friends and Family:   . Frequency of Social Gatherings with Friends and Family:   . Attends Religious Services:   . Active Member of Clubs or Organizations:   . Attends Archivist Meetings:   Marland Kitchen Marital Status:      Review of Systems: A 12 point ROS discussed and pertinent positives are indicated in the HPI above.  All other systems are negative.  Review of Systems  Constitutional: Negative for chills and fever.  Respiratory: Negative for shortness of breath and  wheezing.   Cardiovascular: Negative for chest pain and palpitations.  Gastrointestinal: Negative for abdominal pain.  Neurological: Negative for headaches.  Psychiatric/Behavioral: Negative for behavioral problems and confusion.    Vital Signs: BP 111/62 (BP Location: Right Arm)   Pulse 72   Temp 98.7 F (37.1 C) (Oral)   Resp 18   LMP 06/18/2019 (Approximate)   SpO2 99%   Physical Exam Vitals and nursing note reviewed.  Constitutional:      General: She is not in acute distress.    Appearance: Normal appearance.  Cardiovascular:     Rate and Rhythm: Normal rate and regular rhythm.     Heart sounds: Normal heart sounds. No murmur.  Pulmonary:     Effort: Pulmonary effort is normal. No respiratory distress.     Breath sounds: Normal breath sounds. No wheezing.  Skin:    General: Skin is warm and dry.  Neurological:     Mental Status: She is alert and  oriented to person, place, and time.      MD Evaluation Airway: WNL Heart: WNL Abdomen: WNL Chest/ Lungs: WNL ASA  Classification: 2 Mallampati/Airway Score: One   Imaging: MR LIVER W WO CONTRAST  Result Date: 06/24/2019 CLINICAL DATA:  Elevated AFP. EXAM: MRI ABDOMEN WITHOUT AND WITH CONTRAST TECHNIQUE: Multiplanar multisequence MR imaging of the abdomen was performed both before and after the administration of intravenous contrast. CONTRAST:  54mL GADAVIST GADOBUTROL 1 MMOL/ML IV SOLN COMPARISON:  None. FINDINGS: Lower chest: No consolidation or pleural effusion. Heart size is normal without pericardial fluid. Limited assessment of the lung bases on MRI. Hepatobiliary: Lobular hepatic contours. No biliary ductal dilation. Small area of hypointensity in the RIGHT hepatic lobe adjacent the gallbladder fossa which normalizes on later phases and measures 10 mm on the venous phase of the study (image 56, series 22). Portal vein is patent. No pericholecystic stranding. No fat or iron grossly in the liver by assessment with in  and out of phase gradient echo imaging. Heterogeneous enhancement without masslike features. This heterogeneous enhancement largely normalizes on later phases. At the periphery there is some marginal hypoattenuation on later phase and there is variability with respect to periportal signal throughout the exam. The portal vein is patent. Hepatic arteries grossly patent with either accessory or replaced LEFT hepatic artery. Pancreas:  No peripancreatic stranding or ductal dilation. Spleen: Spleen is enlarged approximately 16 cm in greatest craniocaudal dimension. Adrenals/Urinary Tract: Adrenal glands are normal. Renal contours are smooth. Symmetric renal enhancement. No hydronephrosis. Stomach/Bowel: No acute gastrointestinal process to the extent evaluated. Study not protocol for bowel evaluation. Vascular/Lymphatic: Other:  No ascites. No visible varices. Musculoskeletal: No focal, suspicious lesion. Signal loss is exhibited on out of phase T1 weighted gradient echo imaging as expected. IMPRESSION: 1. Heterogeneous hepatic enhancement with unusual pattern of periportal and then peripheral hypointensity that normalizes on later phases. Scalloped low signal at the margin on venous phase is somewhat unusual but much of these findings normalize on delayed phase imaging. Findings may be due to underlying cirrhosis or acute hepatic inflammation. Correlate with risk factors for liver disease. 2. The the marginal areas of abnormal signal and periportal changes also raise the question of infiltrative liver process such as amyloidosis; however, there is no secondary finding to support this diagnosis. These areas do not have the typical appearance of an infiltrative neoplasm, this is not suggested by imaging on the current study. Ultimately elastography and or biopsy may be helpful. Three-month follow-up with MRI could be considered as well. 3. Subtle area of hypointensity with rounded margins seen only on venous phase  imaging, not seen on later phase imaging along the gallbladder fossa is of uncertain significance perhaps related to the above process. In a patient with known liver disease this would be characterized as LI-RADS category 3. It appears more geographic on coronal reconstructed images. Suggest attention on 3 month follow-up to this area. 4. Splenomegaly.  No ascites. Electronically Signed   By: Zetta Bills M.D.   On: 06/24/2019 14:52    Labs:  CBC: Recent Labs    05/16/19 1103 06/24/19 1058 07/08/19 1517  WBC 9.1 8.0 8.4  HGB 10.3* 11.1* 10.5*  HCT 31.2* 33.2* 31.1*  PLT 250.0 169.0 167.0    COAGS: Recent Labs    05/16/19 1103 06/24/19 1058 07/08/19 1517  INR 1.4* 1.5* 1.5*    BMP: Recent Labs    05/16/19 1103 06/24/19 1058 07/08/19 1517  NA 137 134* 132*  K  3.9 4.0 4.1  CL 102 98 97  CO2 24 26 23   GLUCOSE 115* 98 91  BUN 10 10 11   CALCIUM 9.9 10.1 10.0  CREATININE 0.63 0.84 0.78    LIVER FUNCTION TESTS: Recent Labs    05/16/19 1103 06/24/19 1058 07/08/19 1517  BILITOT 3.3* 3.2* 2.8*  AST 92* 57* 51*  ALT 52* 32 29  ALKPHOS 103 102 98  PROT 7.7 8.4* 8.0  ALBUMIN 4.0 4.3 4.2    TUMOR MARKERS: Recent Labs    05/16/19 1103  AFPTM 10.6*    Assessment and Plan:  Elevated LFTs in setting of alcoholic liver disease, with MR results revealing abnormal signal and periportal changes concerning for infiltrative process. Plan for image-guided transjugular liver biopsy today in IR. Patient is NPO. Afebrile. She does not take blood thinners. INR 1.5 07/08/2019.  Risks and benefits discussed with the patient including, but not limited to bleeding, infection, damage to adjacent structures or low yield requiring additional tests. All of the patient's questions were answered, patient is agreeable to proceed. Consent signed and in chart.   Thank you for this interesting consult.  I greatly enjoyed meeting SHARETA KUENZI and look forward to participating in  their care.  A copy of this report was sent to the requesting provider on this date.  Electronically Signed: Earley Abide, PA-C 07/10/2019, 8:36 AM   I spent a total of 40 Minutes in face to face in clinical consultation, greater than 50% of which was counseling/coordinating care for elevated LFTs/transjugular liver biopsy.

## 2019-07-10 NOTE — Discharge Instructions (Signed)
Please call Interventional Radiology clinic [phone] with any questions or concerns.  You may remove your dressing and shower tomorrow.    Moderate Conscious Sedation, Adult, Care After These instructions provide you with information about caring for yourself after your procedure. Your health care provider may also give you more specific instructions. Your treatment has been planned according to current medical practices, but problems sometimes occur. Call your health care provider if you have any problems or questions after your procedure. What can I expect after the procedure? After your procedure, it is common:  To feel sleepy for several hours.  To feel clumsy and have poor balance for several hours.  To have poor judgment for several hours.  To vomit if you eat too soon. Follow these instructions at home: For at least 24 hours after the procedure:   Do not: ? Participate in activities where you could fall or become injured. ? Drive. ? Use heavy machinery. ? Drink alcohol. ? Take sleeping pills or medicines that cause drowsiness. ? Make important decisions or sign legal documents. ? Take care of children on your own.  Rest. Eating and drinking  Follow the diet recommended by your health care provider.  If you vomit: ? Drink water, juice, or soup when you can drink without vomiting. ? Make sure you have little or no nausea before eating solid foods. General instructions  Have a responsible adult stay with you until you are awake and alert.  Take over-the-counter and prescription medicines only as told by your health care provider.  If you smoke, do not smoke without supervision.  Keep all follow-up visits as told by your health care provider. This is important. Contact a health care provider if:  You keep feeling nauseous or you keep vomiting.  You feel light-headed.  You develop a rash.  You have a fever. Get help right away if:  You have trouble  breathing. This information is not intended to replace advice given to you by your health care provider. Make sure you discuss any questions you have with your health care provider. Document Revised: 01/05/2017 Document Reviewed: 05/15/2015 Elsevier Patient Education  2020 Elsevier Inc.   Liver Biopsy, Care After These instructions give you information on caring for yourself after your procedure. Your doctor may also give you more specific instructions. Call your doctor if you have any problems or questions after your procedure. What can I expect after the procedure? After the procedure, it is common to have:  Pain and soreness where the biopsy was done.  Bruising around the area where the biopsy was done.  Sleepiness and be tired for a few days. Follow these instructions at home: Medicines  Take over-the-counter and prescription medicines only as told by your doctor.  If you were prescribed an antibiotic medicine, take it as told by your doctor. Do not stop taking the antibiotic even if you start to feel better.  Do not take medicines such as aspirin and ibuprofen. These medicines can thin your blood. Do not take these medicines unless your doctor tells you to take them.  If you are taking prescription pain medicine, take actions to prevent or treat constipation. Your doctor may recommend that you: ? Drink enough fluid to keep your pee (urine) clear or pale yellow. ? Take over-the-counter or prescription medicines. ? Eat foods that are high in fiber, such as fresh fruits and vegetables, whole grains, and beans. ? Limit foods that are high in fat and processed sugars, such as   fried and sweet foods. Caring for your cut  Follow instructions from your doctor about how to take care of your cuts from surgery (incisions). Make sure you: ? Wash your hands with soap and water before you change your bandage (dressing). If you cannot use soap and water, use hand sanitizer. ? Change your  bandage as told by your doctor. ? Leave stitches (sutures), skin glue, or skin tape (adhesive) strips in place. They may need to stay in place for 2 weeks or longer. If tape strips get loose and curl up, you may trim the loose edges. Do not remove tape strips completely unless your doctor says it is okay.  Check your cuts every day for signs of infection. Check for: ? Redness, swelling, or more pain. ? Fluid or blood. ? Pus or a bad smell. ? Warmth.  Do not take baths, swim, or use a hot tub until your doctor says it is okay to do so. Activity   Rest at home for 1-2 days or as told by your doctor. ? Avoid sitting for a long time without moving. Get up to take short walks every 1-2 hours.  Return to your normal activities as told by your doctor. Ask what activities are safe for you.  Do not do these things in the first 24 hours: ? Drive. ? Use machinery. ? Take a bath or shower.  Do not lift more than 10 pounds (4.5 kg) or play contact sports for the first 2 weeks. General instructions   Do not drink alcohol in the first week after the procedure.  Have someone stay with you for at least 24 hours after the procedure.  Get your test results. Ask your doctor or the department that is doing the test: ? When will my results be ready? ? How will I get my results? ? What are my treatment options? ? What other tests do I need? ? What are my next steps?  Keep all follow-up visits as told by your doctor. This is important. Contact a doctor if:  A cut bleeds and leaves more than just a small spot of blood.  A cut is red, puffs up (swells), or hurts more than before.  Fluid or something else comes from a cut.  A cut smells bad.  You have a fever or chills. Get help right away if:  You have swelling, bloating, or pain in your belly (abdomen).  You get dizzy or faint.  You have a rash.  You feel sick to your stomach (nauseous) or throw up (vomit).  You have trouble  breathing, feel short of breath, or feel faint.  Your chest hurts.  You have problems talking or seeing.  You have trouble with your balance or moving your arms or legs. Summary  After the procedure, it is common to have pain, soreness, bruising, and tiredness.  Your doctor will tell you how to take care of yourself at home. Change your bandage, take your medicines, and limit your activities as told by your doctor.  Call your doctor if you have symptoms of infection. Get help right away if your belly swells, your cut bleeds a lot, or you have trouble talking or breathing. This information is not intended to replace advice given to you by your health care provider. Make sure you discuss any questions you have with your health care provider. Document Revised: 02/02/2017 Document Reviewed: 02/02/2017 Elsevier Patient Education  2020 Elsevier Inc.  

## 2019-07-10 NOTE — Procedures (Signed)
Interventional Radiology Procedure:   Indications: Alcoholic liver disease  Procedure: Transjugular liver biopsy with hepatic pressures  Findings: Elevated hepatic venous gradient (see full report in Imaging).  4 cores obtained.  Complications: none     EBL: less than 10 ml  Plan: Bedrest 3 hours   Shonta Phillis R. Anselm Pancoast, MD  Pager: [phone]

## 2019-07-11 ENCOUNTER — Other Ambulatory Visit: Payer: Self-pay

## 2019-07-14 LAB — SURGICAL PATHOLOGY

## 2019-07-17 ENCOUNTER — Telehealth: Payer: Self-pay | Admitting: Nurse Practitioner

## 2019-07-17 ENCOUNTER — Encounter: Payer: Self-pay | Admitting: Nurse Practitioner

## 2019-07-18 ENCOUNTER — Telehealth: Payer: Self-pay | Admitting: General Surgery

## 2019-07-18 NOTE — Telephone Encounter (Signed)
Received a fax from [facility] stating they would cover Xifaxin. Start date: 06/16/2019 through 07/17/2019-Case #[id number]

## 2019-07-18 NOTE — Telephone Encounter (Signed)
Yes, Xifaxan 550mg  one po bid # 60, 3 refills thx

## 2019-07-18 NOTE — Telephone Encounter (Signed)
Rx sent to the pharmacy on 07/09/2019

## 2019-07-21 ENCOUNTER — Other Ambulatory Visit: Payer: Self-pay

## 2019-07-21 ENCOUNTER — Encounter: Payer: Self-pay | Admitting: Cardiology

## 2019-07-21 ENCOUNTER — Ambulatory Visit (INDEPENDENT_AMBULATORY_CARE_PROVIDER_SITE_OTHER): Payer: 59 | Admitting: Cardiology

## 2019-07-21 VITALS — BP 110/60 | HR 72 | Ht 69.0 in | Wt 161.0 lb

## 2019-07-21 DIAGNOSIS — I252 Old myocardial infarction: Secondary | ICD-10-CM | POA: Diagnosis not present

## 2019-07-21 DIAGNOSIS — Z87891 Personal history of nicotine dependence: Secondary | ICD-10-CM

## 2019-07-21 DIAGNOSIS — R160 Hepatomegaly, not elsewhere classified: Secondary | ICD-10-CM

## 2019-07-21 DIAGNOSIS — K709 Alcoholic liver disease, unspecified: Secondary | ICD-10-CM | POA: Diagnosis not present

## 2019-07-21 HISTORY — DX: Personal history of nicotine dependence: Z87.891

## 2019-07-21 NOTE — Progress Notes (Signed)
Cardiology Office Note:    Date:  07/21/2019   ID:  Frances Williams, DOB 27-Dec-1980, MRN [id number]  PCP:  Patient, No Pcp Per  Cardiologist:  Jenne Campus, MD    Referring MD: No ref. provider found   Chief Complaint  Patient presents with  . Follow-up  Doing much better  History of Present Illness:    Frances Williams is a 39 y.o. female with incredible past medical history.  He does have history of alcoholism.  She ended up having cirrhosis of the liver.  Story is quite more complicated.  When she was in the hospital because of GI bleeding secondary to varicosities she ended up having abnormal troponin I.  Feeling was that she did suffer from acute coronary syndrome type II.  That was related to hypoxia as well as anemia.  That was treated conservatively.  Since that time she is doing amazingly well.  She quit drinking, she quit smoking.  She also got married.  She went to rehab and she stay away from smoking and drinking.  Denies have any chest pain tightness squeezing pressure burning chest.  Cardiac wise seems to be doing well.  Past Medical History:  Diagnosis Date  . Alcohol abuse   . Alcoholic liver disease ([place]) 05/16/2019  . Brain tumor ([place])    BENIGN REMOVAL JULY 2001  . Chronic kidney disease   . Hematemesis 05/16/2019  . Hepatomegaly 05/16/2019  . Hypertension   . Seizure [facility])     Past Surgical History:  Procedure Laterality Date  . BRAIN SURGERY     2001 on temporal lobe  . ESOPHAGOGASTRODUODENOSCOPY  05/02/2019   [facility] [place]  . IR TRANSCATHETER BX  07/10/2019  . IR US GUIDE VASC ACCESS RIGHT  07/10/2019  . IR VENOGRAM HEPATIC W HEMODYNAMIC EVALUATION  07/10/2019    Current Medications: No outpatient medications have been marked as taking for the 07/21/19 encounter (Office Visit) with Park Liter, MD.     Allergies:   Patient has no known allergies.   Social History   Socioeconomic History  . Marital status: Single    Spouse name: Not on  file  . Number of children: Not on file  . Years of education: Not on file  . Highest education level: Not on file  Occupational History  . Not on file  Tobacco Use  . Smoking status: Former Research scientist (life sciences)  . Smokeless tobacco: Never Used  . Tobacco comment: quit 05/01/2019  Vaping Use  . Vaping Use: Former  Substance and Sexual Activity  . Alcohol use: Not Currently    Comment: quit 05/01/2019  . Drug use: Not Currently    Types: "Crack" cocaine, Marijuana  . Sexual activity: Not on file  Other Topics Concern  . Not on file  Social History Narrative  . Not on file   Social Determinants of Health   Financial Resource Strain:   . Difficulty of Paying Living Expenses:   Food Insecurity:   . Worried About Charity fundraiser in the Last Year:   . Arboriculturist in the Last Year:   Transportation Needs:   . Film/video editor (Medical):   Marland Kitchen Lack of Transportation (Non-Medical):   Physical Activity:   . Days of Exercise per Week:   . Minutes of Exercise per Session:   Stress:   . Feeling of Stress :   Social Connections:   . Frequency of Communication with Friends and Family:   .  Frequency of Social Gatherings with Friends and Family:   . Attends Religious Services:   . Active Member of Clubs or Organizations:   . Attends Archivist Meetings:   Marland Kitchen Marital Status:      Family History: The patient's family history includes Breast cancer (age of onset: 110) in her mother; Ovarian cancer in her paternal grandmother. There is no history of Colon cancer or Esophageal cancer. ROS:   Please see the history of present illness.    All 14 point review of systems negative except as described per history of present illness  EKGs/Labs/Other Studies Reviewed:      Recent Labs: 05/16/2019: TSH 3.87 07/10/2019: ALT 37; BUN 11; Creatinine, Ser 0.78; Hemoglobin 11.1; Platelets 167; Potassium 3.9; Sodium 133  Recent Lipid Panel No results found for: CHOL, TRIG, HDL, CHOLHDL, VLDL,  LDLCALC, LDLDIRECT  Physical Exam:    VS:  BP 110/60   Pulse 72   Ht 5\' 9"  (1.753 m)   Wt 161 lb (73 kg)   SpO2 99%   BMI 23.78 kg/m     Wt Readings from Last 3 Encounters:  07/21/19 161 lb (73 kg)  06/30/19 160 lb (72.6 kg)  05/20/19 212 lb 12.8 oz (96.5 kg)     GEN:  Well nourished, well developed in no acute distress HEENT: Normal NECK: No JVD; No carotid bruits LYMPHATICS: No lymphadenopathy CARDIAC: RRR, no murmurs, no rubs, no gallops RESPIRATORY:  Clear to auscultation without rales, wheezing or rhonchi  ABDOMEN: Soft, non-tender, non-distended MUSCULOSKELETAL:  No edema; No deformity  SKIN: Warm and dry LOWER EXTREMITIES: no swelling NEUROLOGIC:  Alert and oriented x 3 PSYCHIATRIC:  Normal affect   ASSESSMENT:    1. History of MI (myocardial infarction)   2. Alcoholic liver disease ([place])   3. Hepatomegaly   4. Smoking history    PLAN:    In order of problems listed above:  1. History of myocardial infarction: Related to severe anemia as well as hypoxia.  Therefore, with dealing with myocardial infarction type II secondary to demand ischemia.  She is on appropriate medication for now which I will continue.  In the future we will repeat echocardiogram to recheck left ventricle ejection fraction. 2. Alcoholic liver disease with recently proven cirrhosis.  Follow-up by GI team. 3. History of smoking: Does not smoke anymore. 4. Cholesterol status is unclear right now.  We will recheck her fasting lipid profile within next visit and decide about treatment.  Overall I am impressed with her progress.  She quit drinking quit smoking and she is feeling significantly better.  Obviously she does have already sequela of years of drinking which is cirrhosis of the liver.  Her coronary event was a type II.   Medication Adjustments/Labs and Tests Ordered: Current medicines are reviewed at length with the patient today.  Concerns regarding medicines are outlined above.  No  orders of the defined types were placed in this encounter.  Medication changes: No orders of the defined types were placed in this encounter.   Signed, Park Liter, MD, [facility] 07/21/2019 11:24 AM    [place]

## 2019-07-21 NOTE — Patient Instructions (Signed)
Medication Instructions:  Your physician recommends that you continue on your current medications as directed. Please refer to the Current Medication list given to you today.  *If you need a refill on your cardiac medications before your next appointment, please call your pharmacy*   Lab Work: None If you have labs (blood work) drawn today and your tests are completely normal, you will receive your results only by: . MyChart Message (if you have MyChart) OR . A paper copy in the mail If you have any lab test that is abnormal or we need to change your treatment, we will call you to review the results.   Testing/Procedures: .Your physician has requested that you have an echocardiogram. Echocardiography is a painless test that uses sound waves to create images of your heart. It provides your doctor with information about the size and shape of your heart and how well your heart's chambers and valves are working. This procedure takes approximately one hour. There are no restrictions for this procedure.     Follow-Up: At [facility], you and your health needs are our priority.  As part of our continuing mission to provide you with exceptional heart care, we have created designated Provider Care Teams.  These Care Teams include your primary Cardiologist (physician) and Advanced Practice Providers (APPs -  Physician Assistants and Nurse Practitioners) who all work together to provide you with the care you need, when you need it.  We recommend signing up for the patient portal called "MyChart".  Sign up information is provided on this After Visit Summary.  MyChart is used to connect with patients for Virtual Visits (Telemedicine).  Patients are able to view lab/test results, encounter notes, upcoming appointments, etc.  Non-urgent messages can be sent to your provider as well.   To learn more about what you can do with MyChart, go to [email or web address].    Your next appointment:   4  month(s)  The format for your next appointment:   In Person  Provider:   Robert Krasowski, MD   Other Instructions   Echocardiogram An echocardiogram is a procedure that uses painless sound waves (ultrasound) to produce an image of the heart. Images from an echocardiogram can provide important information about:  Signs of coronary artery disease (CAD).  Aneurysm detection. An aneurysm is a weak or damaged part of an artery wall that bulges out from the normal force of blood pumping through the body.  Heart size and shape. Changes in the size or shape of the heart can be associated with certain conditions, including heart failure, aneurysm, and CAD.  Heart muscle function.  Heart valve function.  Signs of a past heart attack.  Fluid buildup around the heart.  Thickening of the heart muscle.  A tumor or infectious growth around the heart valves. Tell a health care provider about:  Any allergies you have.  All medicines you are taking, including vitamins, herbs, eye drops, creams, and over-the-counter medicines.  Any blood disorders you have.  Any surgeries you have had.  Any medical conditions you have.  Whether you are pregnant or may be pregnant. What are the risks? Generally, this is a safe procedure. However, problems may occur, including:  Allergic reaction to dye (contrast) that may be used during the procedure. What happens before the procedure? No specific preparation is needed. You may eat and drink normally. What happens during the procedure?   An IV tube may be inserted into one of your veins.  You may receive   contrast through this tube. A contrast is an injection that improves the quality of the pictures from your heart.  A gel will be applied to your chest.  A wand-like tool (transducer) will be moved over your chest. The gel will help to transmit the sound waves from the transducer.  The sound waves will harmlessly bounce off of your heart to  allow the heart images to be captured in real-time motion. The images will be recorded on a computer. The procedure may vary among health care providers and hospitals. What happens after the procedure?  You may return to your normal, everyday life, including diet, activities, and medicines, unless your health care provider tells you not to do that. Summary  An echocardiogram is a procedure that uses painless sound waves (ultrasound) to produce an image of the heart.  Images from an echocardiogram can provide important information about the size and shape of your heart, heart muscle function, heart valve function, and fluid buildup around your heart.  You do not need to do anything to prepare before this procedure. You may eat and drink normally.  After the echocardiogram is completed, you may return to your normal, everyday life, unless your health care provider tells you not to do that. This information is not intended to replace advice given to you by your health care provider. Make sure you discuss any questions you have with your health care provider. Document Revised: 05/16/2018 Document Reviewed: 02/26/2016 Elsevier Patient Education  2020 Elsevier Inc.   

## 2019-07-21 NOTE — Addendum Note (Signed)
Addended by: Ashok Norris on: 07/21/2019 11:41 AM   Modules accepted: Orders

## 2019-07-30 ENCOUNTER — Telehealth: Payer: Self-pay | Admitting: Nurse Practitioner

## 2019-07-30 ENCOUNTER — Other Ambulatory Visit: Payer: Self-pay

## 2019-07-30 DIAGNOSIS — K7030 Alcoholic cirrhosis of liver without ascites: Secondary | ICD-10-CM

## 2019-07-30 DIAGNOSIS — F1010 Alcohol abuse, uncomplicated: Secondary | ICD-10-CM

## 2019-07-30 NOTE — Telephone Encounter (Signed)
Spoke with patient, see results note for more information.

## 2019-08-04 ENCOUNTER — Other Ambulatory Visit (INDEPENDENT_AMBULATORY_CARE_PROVIDER_SITE_OTHER): Payer: 59

## 2019-08-04 DIAGNOSIS — F1010 Alcohol abuse, uncomplicated: Secondary | ICD-10-CM | POA: Diagnosis not present

## 2019-08-04 DIAGNOSIS — K7030 Alcoholic cirrhosis of liver without ascites: Secondary | ICD-10-CM | POA: Diagnosis not present

## 2019-08-04 LAB — BASIC METABOLIC PANEL
BUN: 10 mg/dL (ref 6–23)
CO2: 24 mEq/L (ref 19–32)
Calcium: 10.4 mg/dL (ref 8.4–10.5)
Chloride: 100 mEq/L (ref 96–112)
Creatinine, Ser: 0.8 mg/dL (ref 0.40–1.20)
GFR: 80.81 mL/min (ref >60.00)
Glucose, Bld: 102 mg/dL — ABNORMAL HIGH (ref 70–99)
Potassium: 3.4 mEq/L — ABNORMAL LOW (ref 3.5–5.1)
Sodium: 134 mEq/L — ABNORMAL LOW (ref 135–145)

## 2019-08-04 LAB — CBC WITH DIFFERENTIAL/PLATELET
Basophils Absolute: 0 10*3/uL (ref 0.0–0.1)
Basophils Relative: 0.7 % (ref 0.0–3.0)
Eosinophils Absolute: 0.1 10*3/uL (ref 0.0–0.7)
Eosinophils Relative: 1.3 % (ref 0.0–5.0)
HCT: 32.8 % — ABNORMAL LOW (ref 36.0–46.0)
Hemoglobin: 11.1 g/dL — ABNORMAL LOW (ref 12.0–15.0)
Lymphocytes Relative: 38.3 % (ref 12.0–46.0)
Lymphs Abs: 2.7 10*3/uL (ref 0.7–4.0)
MCHC: 33.8 g/dL (ref 30.0–36.0)
MCV: 87.5 fl (ref 78.0–100.0)
Monocytes Absolute: 0.4 10*3/uL (ref 0.1–1.0)
Monocytes Relative: 5.1 % (ref 3.0–12.0)
Neutro Abs: 3.8 10*3/uL (ref 1.4–7.7)
Neutrophils Relative %: 54.6 % (ref 43.0–77.0)
Platelets: 163 10*3/uL (ref 150.0–400.0)
RBC: 3.75 Mil/uL — ABNORMAL LOW (ref 3.87–5.11)
RDW: 20.1 % — ABNORMAL HIGH (ref 11.5–15.5)
WBC: 6.9 10*3/uL (ref 4.0–10.5)

## 2019-08-04 LAB — HEPATIC FUNCTION PANEL
ALT: 67 U/L — ABNORMAL HIGH (ref 0–35)
AST: 88 U/L — ABNORMAL HIGH (ref 0–37)
Albumin: 4.5 g/dL (ref 3.5–5.2)
Alkaline Phosphatase: 111 U/L (ref 39–117)
Bilirubin, Direct: 0.5 mg/dL — ABNORMAL HIGH (ref 0.0–0.3)
Total Bilirubin: 2.7 mg/dL — ABNORMAL HIGH (ref 0.2–1.2)
Total Protein: 8.4 g/dL — ABNORMAL HIGH (ref 6.0–8.3)

## 2019-08-04 LAB — PROTIME-INR
INR: 1.5 ratio — ABNORMAL HIGH (ref 0.8–1.0)
Prothrombin Time: 16.3 s — ABNORMAL HIGH (ref 9.6–13.1)

## 2019-08-07 ENCOUNTER — Encounter: Payer: Self-pay | Admitting: Gastroenterology

## 2019-08-07 ENCOUNTER — Ambulatory Visit (INDEPENDENT_AMBULATORY_CARE_PROVIDER_SITE_OTHER): Payer: BC Managed Care – PPO | Admitting: Gastroenterology

## 2019-08-07 VITALS — BP 108/60 | HR 51 | Temp 97.3°F | Ht 68.0 in | Wt 156.0 lb

## 2019-08-07 DIAGNOSIS — K7460 Unspecified cirrhosis of liver: Secondary | ICD-10-CM

## 2019-08-07 MED ORDER — POTASSIUM CHLORIDE ER 20 MEQ PO TBCR: 20.0000 meq | EXTENDED_RELEASE_TABLET | Freq: Every day | ORAL | 0 refills | Status: DC

## 2019-08-07 NOTE — Patient Instructions (Addendum)
If you are age 37 or older, your body mass index should be between 23-30. Your Body mass index is 23.72 kg/m. If this is out of the aforementioned range listed, please consider follow up with your Primary Care Provider.  If you are age 73 or younger, your body mass index should be between 19-25. Your Body mass index is 23.72 kg/m. If this is out of the aformentioned range listed, please consider follow up with your Primary Care Provider.   You have been scheduled for an MRI at [facility]    on 11/06/19. Your appointment time is 10 am . Please arrive 30 minutes prior to your appointment time for registration purposes. Please make certain not to have anything to eat or drink 6 hours prior to your test. In addition, if you have any metal in your body, have a pacemaker or defibrillator, please be sure to let your ordering physician know. This test typically takes 45 minutes to 1 hour to complete. Should you need to reschedule, please call [phone] to do so.  Your provider has requested that you go to the basement level for lab work before leaving today. Press "B" on the elevator. The lab is located at the first door on the left as you exit the elevator. GO ONE WEEK PRIOR TO APPOINTMENT IN THREE MONTHS.  We have sent the following medications to your pharmacy for you to pick up at your convenience: K Dur 20 meq  NO ALCOHOL.  Low Salt diet - see attached.  Follow up with me in three months.  Please call the office for an appointment as the schedule is not available at this time.  Thank you for choosing me and [facility].   Jackquline Denmark, MD   Low-Sodium Eating Plan Sodium, which is an element that makes up salt, helps you maintain a healthy balance of fluids in your body. Too much sodium can increase your blood pressure and cause fluid and waste to be held in your body. Your health care provider or dietitian may recommend following this plan if you have high blood  pressure (hypertension), kidney disease, liver disease, or heart failure. Eating less sodium can help lower your blood pressure, reduce swelling, and protect your heart, liver, and kidneys. What are tips for following this plan? General guidelines  Most people on this plan should limit their sodium intake to 1,500-2,000 mg (milligrams) of sodium each day. Reading food labels   The Nutrition Facts label lists the amount of sodium in one serving of the food. If you eat more than one serving, you must multiply the listed amount of sodium by the number of servings.  Choose foods with less than 140 mg of sodium per serving.  Avoid foods with 300 mg of sodium or more per serving. Shopping  Look for lower-sodium products, often labeled as "low-sodium" or "no salt added."  Always check the sodium content even if foods are labeled as "unsalted" or "no salt added".  Buy fresh foods. ? Avoid canned foods and premade or frozen meals. ? Avoid canned, cured, or processed meats  Buy breads that have less than 80 mg of sodium per slice. Cooking  Eat more home-cooked food and less restaurant, buffet, and fast food.  Avoid adding salt when cooking. Use salt-free seasonings or herbs instead of table salt or sea salt. Check with your health care provider or pharmacist before using salt substitutes.  Cook with plant-based oils, such as canola, sunflower, or olive oil. Meal  planning  When eating at a restaurant, ask that your food be prepared with less salt or no salt, if possible.  Avoid foods that contain MSG (monosodium glutamate). MSG is sometimes added to [place] food, bouillon, and some canned foods. What foods are recommended? The items listed may not be a complete list. Talk with your dietitian about what dietary choices are best for you. Grains Low-sodium cereals, including oats, puffed wheat and rice, and shredded wheat. Low-sodium crackers. Unsalted rice. Unsalted pasta. Low-sodium bread.  Whole-grain breads and whole-grain pasta. Vegetables Fresh or frozen vegetables. "No salt added" canned vegetables. "No salt added" tomato sauce and paste. Low-sodium or reduced-sodium tomato and vegetable juice. Fruits Fresh, frozen, or canned fruit. Fruit juice. Meats and other protein foods Fresh or frozen (no salt added) meat, poultry, seafood, and fish. Low-sodium canned tuna and salmon. Unsalted nuts. Dried peas, beans, and lentils without added salt. Unsalted canned beans. Eggs. Unsalted nut butters. Dairy Milk. Soy milk. Cheese that is naturally low in sodium, such as ricotta cheese, fresh mozzarella, or Swiss cheese Low-sodium or reduced-sodium cheese. Cream cheese. Yogurt. Fats and oils Unsalted butter. Unsalted margarine with no trans fat. Vegetable oils such as canola or olive oils. Seasonings and other foods Fresh and dried herbs and spices. Salt-free seasonings. Low-sodium mustard and ketchup. Sodium-free salad dressing. Sodium-free light mayonnaise. Fresh or refrigerated horseradish. Lemon juice. Vinegar. Homemade, reduced-sodium, or low-sodium soups. Unsalted popcorn and pretzels. Low-salt or salt-free chips. What foods are not recommended? The items listed may not be a complete list. Talk with your dietitian about what dietary choices are best for you. Grains Instant hot cereals. Bread stuffing, pancake, and biscuit mixes. Croutons. Seasoned rice or pasta mixes. Noodle soup cups. Boxed or frozen macaroni and cheese. Regular salted crackers. Self-rising flour. Vegetables Sauerkraut, pickled vegetables, and relishes. Olives. [place] fries. Onion rings. Regular canned vegetables (not low-sodium or reduced-sodium). Regular canned tomato sauce and paste (not low-sodium or reduced-sodium). Regular tomato and vegetable juice (not low-sodium or reduced-sodium). Frozen vegetables in sauces. Meats and other protein foods Meat or fish that is salted, canned, smoked, spiced, or pickled.  Bacon, ham, sausage, hotdogs, corned beef, chipped beef, packaged lunch meats, salt pork, jerky, pickled herring, anchovies, regular canned tuna, sardines, salted nuts. Dairy Processed cheese and cheese spreads. Cheese curds. Blue cheese. Feta cheese. String cheese. Regular cottage cheese. Buttermilk. Canned milk. Fats and oils Salted butter. Regular margarine. Ghee. Bacon fat. Seasonings and other foods Onion salt, garlic salt, seasoned salt, table salt, and sea salt. Canned and packaged gravies. Worcestershire sauce. Tartar sauce. Barbecue sauce. Teriyaki sauce. Soy sauce, including reduced-sodium. Steak sauce. Fish sauce. Oyster sauce. Cocktail sauce. Horseradish that you find on the shelf. Regular ketchup and mustard. Meat flavorings and tenderizers. Bouillon cubes. Hot sauce and Tabasco sauce. Premade or packaged marinades. Premade or packaged taco seasonings. Relishes. Regular salad dressings. Salsa. Potato and tortilla chips. Corn chips and puffs. Salted popcorn and pretzels. Canned or dried soups. Pizza. Frozen entrees and pot pies. Summary  Eating less sodium can help lower your blood pressure, reduce swelling, and protect your heart, liver, and kidneys.  Most people on this plan should limit their sodium intake to 1,500-2,000 mg (milligrams) of sodium each day.  Canned, boxed, and frozen foods are high in sodium. Restaurant foods, fast foods, and pizza are also very high in sodium. You also get sodium by adding salt to food.  Try to cook at home, eat more fresh fruits and vegetables, and eat less fast  food, canned, processed, or prepared foods. This information is not intended to replace advice given to you by your health care provider. Make sure you discuss any questions you have with your health care provider. Document Revised: 01/05/2017 Document Reviewed: 01/17/2016 Elsevier Patient Education  [place].   Low-Sodium Eating Plan Sodium, which is an element that makes up  salt, helps you maintain a healthy balance of fluids in your body. Too much sodium can increase your blood pressure and cause fluid and waste to be held in your body. Your health care provider or dietitian may recommend following this plan if you have high blood pressure (hypertension), kidney disease, liver disease, or heart failure. Eating less sodium can help lower your blood pressure, reduce swelling, and protect your heart, liver, and kidneys. What are tips for following this plan? General guidelines  Most people on this plan should limit their sodium intake to 1,500-2,000 mg (milligrams) of sodium each day. Reading food labels   The Nutrition Facts label lists the amount of sodium in one serving of the food. If you eat more than one serving, you must multiply the listed amount of sodium by the number of servings.  Choose foods with less than 140 mg of sodium per serving.  Avoid foods with 300 mg of sodium or more per serving. Shopping  Look for lower-sodium products, often labeled as "low-sodium" or "no salt added."  Always check the sodium content even if foods are labeled as "unsalted" or "no salt added".  Buy fresh foods. ? Avoid canned foods and premade or frozen meals. ? Avoid canned, cured, or processed meats  Buy breads that have less than 80 mg of sodium per slice. Cooking  Eat more home-cooked food and less restaurant, buffet, and fast food.  Avoid adding salt when cooking. Use salt-free seasonings or herbs instead of table salt or sea salt. Check with your health care provider or pharmacist before using salt substitutes.  Cook with plant-based oils, such as canola, sunflower, or olive oil. Meal planning  When eating at a restaurant, ask that your food be prepared with less salt or no salt, if possible.  Avoid foods that contain MSG (monosodium glutamate). MSG is sometimes added to [place] food, bouillon, and some canned foods. What foods are recommended? The  items listed may not be a complete list. Talk with your dietitian about what dietary choices are best for you. Grains Low-sodium cereals, including oats, puffed wheat and rice, and shredded wheat. Low-sodium crackers. Unsalted rice. Unsalted pasta. Low-sodium bread. Whole-grain breads and whole-grain pasta. Vegetables Fresh or frozen vegetables. "No salt added" canned vegetables. "No salt added" tomato sauce and paste. Low-sodium or reduced-sodium tomato and vegetable juice. Fruits Fresh, frozen, or canned fruit. Fruit juice. Meats and other protein foods Fresh or frozen (no salt added) meat, poultry, seafood, and fish. Low-sodium canned tuna and salmon. Unsalted nuts. Dried peas, beans, and lentils without added salt. Unsalted canned beans. Eggs. Unsalted nut butters. Dairy Milk. Soy milk. Cheese that is naturally low in sodium, such as ricotta cheese, fresh mozzarella, or Swiss cheese Low-sodium or reduced-sodium cheese. Cream cheese. Yogurt. Fats and oils Unsalted butter. Unsalted margarine with no trans fat. Vegetable oils such as canola or olive oils. Seasonings and other foods Fresh and dried herbs and spices. Salt-free seasonings. Low-sodium mustard and ketchup. Sodium-free salad dressing. Sodium-free light mayonnaise. Fresh or refrigerated horseradish. Lemon juice. Vinegar. Homemade, reduced-sodium, or low-sodium soups. Unsalted popcorn and pretzels. Low-salt or salt-free chips. What foods  are not recommended? The items listed may not be a complete list. Talk with your dietitian about what dietary choices are best for you. Grains Instant hot cereals. Bread stuffing, pancake, and biscuit mixes. Croutons. Seasoned rice or pasta mixes. Noodle soup cups. Boxed or frozen macaroni and cheese. Regular salted crackers. Self-rising flour. Vegetables Sauerkraut, pickled vegetables, and relishes. Olives. [place] fries. Onion rings. Regular canned vegetables (not low-sodium or reduced-sodium). Regular  canned tomato sauce and paste (not low-sodium or reduced-sodium). Regular tomato and vegetable juice (not low-sodium or reduced-sodium). Frozen vegetables in sauces. Meats and other protein foods Meat or fish that is salted, canned, smoked, spiced, or pickled. Bacon, ham, sausage, hotdogs, corned beef, chipped beef, packaged lunch meats, salt pork, jerky, pickled herring, anchovies, regular canned tuna, sardines, salted nuts. Dairy Processed cheese and cheese spreads. Cheese curds. Blue cheese. Feta cheese. String cheese. Regular cottage cheese. Buttermilk. Canned milk. Fats and oils Salted butter. Regular margarine. Ghee. Bacon fat. Seasonings and other foods Onion salt, garlic salt, seasoned salt, table salt, and sea salt. Canned and packaged gravies. Worcestershire sauce. Tartar sauce. Barbecue sauce. Teriyaki sauce. Soy sauce, including reduced-sodium. Steak sauce. Fish sauce. Oyster sauce. Cocktail sauce. Horseradish that you find on the shelf. Regular ketchup and mustard. Meat flavorings and tenderizers. Bouillon cubes. Hot sauce and Tabasco sauce. Premade or packaged marinades. Premade or packaged taco seasonings. Relishes. Regular salad dressings. Salsa. Potato and tortilla chips. Corn chips and puffs. Salted popcorn and pretzels. Canned or dried soups. Pizza. Frozen entrees and pot pies. Summary  Eating less sodium can help lower your blood pressure, reduce swelling, and protect your heart, liver, and kidneys.  Most people on this plan should limit their sodium intake to 1,500-2,000 mg (milligrams) of sodium each day.  Canned, boxed, and frozen foods are high in sodium. Restaurant foods, fast foods, and pizza are also very high in sodium. You also get sodium by adding salt to food.  Try to cook at home, eat more fresh fruits and vegetables, and eat less fast food, canned, processed, or prepared foods. This information is not intended to replace advice given to you by your health care  provider. Make sure you discuss any questions you have with your health care provider. Document Revised: 01/05/2017 Document Reviewed: 01/17/2016 Elsevier Patient Education  2020 [employer].

## 2019-08-07 NOTE — Progress Notes (Signed)
Chief Complaint:   Referring Provider:  No ref. provider found      ASSESSMENT AND PLAN;   #1.  ETOH Liver cirrhosis with portal HTN. Liver Bx 07/2019: stage 4 of 4 cirrhosis without infiltrative processes . Child's class A. MELD Na 19 (08/04/2019). Quit ETOH since May 01, 2019)  #2. Mild splenomegaly.  #3. Ascites (resolved) #4. Sub-clinical hepatic encephalopathy #5. Abn MRI 06/2019 (LI-RADS-3). Rpt in 3 months.  Plan: - Strictly No ETOH - CBC, CMP, AFP, PT INR prior to MRI - Low salt diet. - MRI  Liver with contrast. Aug 2021 (for FU) - Add Kdur 20mg  po qd (#30) 2 refills. - Continue rest of the medications for now. - FU in 3 months   HPI:    Frances Williams is a 39 y.o. female  Accompanied by her family For follow-up visit.  Feels significantly better.  No alcohol since May 01, 2019.  Has been having 2-3 softer bowel movements per day on lactulose.  Compliant with medications.  No nausea, vomiting, heartburn, regurgitation, odynophagia or dysphagia.  No significant diarrhea.  No melena or hematochezia. No unintentional weight loss. No abdominal pain.  No change in mental status.  Wt Readings from Last 3 Encounters:  08/07/19 156 lb (70.8 kg)  07/21/19 161 lb (73 kg)  06/30/19 160 lb (72.6 kg)   No swelling of the legs.  Denies having any fever chills or night sweats.  Her jaundice has considerably improved.  Prev labs: Labs 05/16/2019: Iron 31. Ferritin 125. Vitamin B12 1131. Ceruloplasmin 30. IgG 1468. A1AT 271. TSH 3.87. Ammonia 105. AFP 10.6.  ANA negative. AMA < 20. SMA < 20. Hepatitis A total ab reactive. Hepatitis B surface antigen nonreactive. Hepatitis  B surface antibody positive. Hepatitis B core IgM nonreactive. Hepatitis C antibody negative. Past Medical History:  Diagnosis Date  . Alcohol abuse   . Alcoholic liver disease ([place]) 05/16/2019  . Brain tumor ([place])    BENIGN REMOVAL JULY 2001  . Chronic kidney disease   . Hematemesis 05/16/2019  .  Hepatomegaly 05/16/2019  . Hypertension   . Seizure [facility])     Past Surgical History:  Procedure Laterality Date  . BRAIN SURGERY     2001 on temporal lobe  . ESOPHAGOGASTRODUODENOSCOPY  05/02/2019   [facility] [place]  . IR TRANSCATHETER BX  07/10/2019  . IR US GUIDE VASC ACCESS RIGHT  07/10/2019  . IR VENOGRAM HEPATIC W HEMODYNAMIC EVALUATION  07/10/2019    Family History  Problem Relation Age of Onset  . Breast cancer Mother 36  . Ovarian cancer Paternal Grandmother   . Colon cancer Neg Hx   . Esophageal cancer Neg Hx     Social History   Tobacco Use  . Smoking status: Former Research scientist (life sciences)  . Smokeless tobacco: Never Used  . Tobacco comment: quit 05/01/2019  Vaping Use  . Vaping Use: Former  Substance Use Topics  . Alcohol use: Not Currently    Comment: quit 05/01/2019  . Drug use: Not Currently    Types: "Crack" cocaine, Marijuana    Current Outpatient Medications  Medication Sig Dispense Refill  . B Complex Vitamins (B COMPLEX-B12 PO) Take by mouth daily.    Marland Kitchen escitalopram (LEXAPRO) 20 MG tablet Take 20 mg by mouth daily.    . folic acid (FOLVITE) 1 MG tablet Take 1 tablet (1 mg total) by mouth daily. 90 tablet 0  . furosemide (LASIX) 20 MG tablet Take 1 tablet (20  mg total) by mouth daily. 30 tablet 1  . lactulose (CHRONULAC) 10 GM/15ML solution Take 30 mLs (20 g total) by mouth 2 (two) times daily. 1800 mL 1  . metoprolol tartrate (LOPRESSOR) 25 MG tablet TAKE 1/2 TABLET TWICE DAILY 30 tablet 3  . Multiple Vitamins-Minerals (ONE-A-DAY WOMENS PO) Take by mouth daily.    . pantoprazole (PROTONIX) 40 MG tablet Take 1 tablet (40 mg total) by mouth 2 (two) times daily. 60 tablet 3  . rifaximin (XIFAXAN) 550 MG TABS tablet Take 1 tablet (550 mg total) by mouth 2 (two) times daily. 60 tablet 1  . spironolactone (ALDACTONE) 50 MG tablet Take 1 tablet (50 mg total) by mouth daily. 30 tablet 1   No current facility-administered medications for this visit.    No Known  Allergies  Review of Systems:  Constitutional: Denies fever, chills, diaphoresis, appetite change and fatigue.  HEENT: Denies photophobia, eye pain, redness, hearing loss, ear pain, congestion, sore throat, rhinorrhea, sneezing, mouth sores, neck pain, neck stiffness and tinnitus.   Respiratory: Denies SOB, DOE, cough, chest tightness,  and wheezing.   Cardiovascular: Denies chest pain, palpitations and leg swelling.  Genitourinary: Denies dysuria, urgency, frequency, hematuria, flank pain and difficulty urinating.  Musculoskeletal: Denies myalgias, back pain, joint swelling, arthralgias and gait problem.  Skin: No rash.  Neurological: Denies dizziness, seizures, syncope, weakness, light-headedness, numbness and headaches.  Hematological: Denies adenopathy. Easy bruising, personal or family bleeding history  Psychiatric/Behavioral: No anxiety or depression     Physical Exam:    BP 108/60   Pulse (!) 51   Temp (!) 97.3 F (36.3 C)   Ht 5\' 8"  (1.727 m)   Wt 156 lb (70.8 kg)   BMI 23.72 kg/m  Wt Readings from Last 3 Encounters:  08/07/19 156 lb (70.8 kg)  07/21/19 161 lb (73 kg)  06/30/19 160 lb (72.6 kg)   Constitutional:  Well-developed, in no acute distress. Psychiatric: Normal mood and affect. Behavior is normal. HEENT: Pupils normal.  Conjunctivae are normal. No scleral icterus. Neck supple.  Cardiovascular: Normal rate, regular rhythm. No edema Pulmonary/chest: Effort normal and breath sounds normal. No wheezing, rales or rhonchi. Abdominal: Soft, nondistended. Nontender. Bowel sounds active throughout. There are no masses palpable. No hepatomegaly. Rectal:  defered Neurological: Alert and oriented to person place and time. Skin: Skin is warm and dry. No rashes noted.  Data Reviewed: I have personally reviewed following labs and imaging studies  CBC: CBC Latest Ref Rng & Units 08/04/2019 07/10/2019 07/08/2019  WBC 4.0 - 10.5 K/uL 6.9 7.4 8.4  Hemoglobin 12.0 - 15.0 g/dL  11.1(L) 11.1(L) 10.5(L)  Hematocrit 36 - 46 % 32.8(L) 34.4(L) 31.1(L)  Platelets 150 - 400 K/uL 163.0 167 167.0    CMP: CMP Latest Ref Rng & Units 08/04/2019 07/10/2019 07/08/2019  Glucose 70 - 99 mg/dL 102(H) 93 91  BUN 6 - 23 mg/dL 10 11 11   Creatinine 0.40 - 1.20 mg/dL 0.80 0.78 0.78  Sodium 135 - 145 mEq/L 134(L) 133(L) 132(L)  Potassium 3.5 - 5.1 mEq/L 3.4(L) 3.9 4.1  Chloride 96 - 112 mEq/L 100 101 97  CO2 19 - 32 mEq/L 24 21(L) 23  Calcium 8.4 - 10.5 mg/dL 10.4 9.5 10.0  Total Protein 6.0 - 8.3 g/dL 8.4(H) 7.9 8.0  Total Bilirubin 0.2 - 1.2 mg/dL 2.7(H) 3.1(H) 2.8(H)  Alkaline Phos 39 - 117 U/L 111 97 98  AST 0 - 37 U/L 88(H) 67(H) 51(H)  ALT 0 - 35 U/L 67(H) 37  29    GFR: Estimated Creatinine Clearance: 98.1 mL/min (by C-G formula based on SCr of 0.8 mg/dL). Liver Function Tests: Recent Labs  Lab 08/04/19 1422  AST 88*  ALT 67*  ALKPHOS 111  BILITOT 2.7*  PROT 8.4*  ALBUMIN 4.5      Radiology Studies: IR Venogram Hepatic W Hemodynamic Evaluation  Result Date: 07/10/2019 INDICATION: 39 year old with alcoholic liver disease. Request for transjugular liver biopsy. EXAM: 1. TRANSJUGULAR LIVER BIOPSY WITH FLUOROSCOPY 2. ULTRASOUND GUIDANCE FOR VASCULAR ACCESS 3. HEPATIC VENOGRAPHY WITH HEPATIC PRESSURES MEDICATIONS: None. ANESTHESIA/SEDATION: Moderate (conscious) sedation was employed during this procedure. A total of Versed 2.0 mg and Fentanyl 100 mcg was administered intravenously. Moderate Sedation Time: 29 minutes. The patient's level of consciousness and vital signs were monitored continuously by radiology nursing throughout the procedure under my direct supervision. FLUOROSCOPY TIME:  Fluoroscopy Time: 4 minutes, 18 seconds, 82 mGy CONTRAST:  25 mL Omnipaque [id number] COMPLICATIONS: None immediate. PROCEDURE: Informed written consent was obtained from the patient after a thorough discussion of the procedural risks, benefits and alternatives. All questions were addressed. Maximal  Sterile Barrier Technique was utilized including caps, mask, sterile gowns, sterile gloves, sterile drape, hand hygiene and skin antiseptic. A timeout was performed prior to the initiation of the procedure. Ultrasound confirmed a patent right internal jugular vein. Ultrasound image was saved for documentation. Right neck was prepped and draped in sterile fashion. Skin was anesthetized with 1% lidocaine. Small incision was made. Using ultrasound guidance, 21 gauge needle was directed into the right internal jugular vein and micropuncture dilator set was placed. Bentson wire was advanced into the SVC and IVC. Tract was dilated to accommodate 10 French sheath. Five French catheter was advanced in the right hepatic vein and hepatic wedge and hepatic free pressures were obtained. Hepatic venography was performed before and after transjugular liver biopsy. Transjugular liver set was advanced through the 10 [place] vascular sheath. Total of 4 transjugular liver biopsies were obtained. Four adequate specimens were obtained and placed in formalin. Vascular sheath was removed with manual compression. Bandage placed over the puncture site. FINDINGS: Transjugular liver biopsies were performed through the right hepatic vein. Four adequate core biopsies were obtained. Hepatic venous pressures: 1. Wedge = 25, Free = 8, gradient = 17 2. Wedge = 21, Free = 8, gradient = 13 IMPRESSION: 1. Successful transjugular liver biopsy. 2. Hepatic venous pressure gradient is elevated. Findings are suggestive for portal hypertension. Electronically Signed   By: Markus Daft M.D.   On: 07/10/2019 16:10   IR Transcatheter BX  Result Date: 07/10/2019 INDICATION: 39 year old with alcoholic liver disease. Request for transjugular liver biopsy. EXAM: 1. TRANSJUGULAR LIVER BIOPSY WITH FLUOROSCOPY 2. ULTRASOUND GUIDANCE FOR VASCULAR ACCESS 3. HEPATIC VENOGRAPHY WITH HEPATIC PRESSURES MEDICATIONS: None. ANESTHESIA/SEDATION: Moderate (conscious) sedation  was employed during this procedure. A total of Versed 2.0 mg and Fentanyl 100 mcg was administered intravenously. Moderate Sedation Time: 29 minutes. The patient's level of consciousness and vital signs were monitored continuously by radiology nursing throughout the procedure under my direct supervision. FLUOROSCOPY TIME:  Fluoroscopy Time: 4 minutes, 18 seconds, 82 mGy CONTRAST:  25 mL Omnipaque [id number] COMPLICATIONS: None immediate. PROCEDURE: Informed written consent was obtained from the patient after a thorough discussion of the procedural risks, benefits and alternatives. All questions were addressed. Maximal Sterile Barrier Technique was utilized including caps, mask, sterile gowns, sterile gloves, sterile drape, hand hygiene and skin antiseptic. A timeout was performed prior to the initiation of the procedure. Ultrasound confirmed a  patent right internal jugular vein. Ultrasound image was saved for documentation. Right neck was prepped and draped in sterile fashion. Skin was anesthetized with 1% lidocaine. Small incision was made. Using ultrasound guidance, 21 gauge needle was directed into the right internal jugular vein and micropuncture dilator set was placed. Bentson wire was advanced into the SVC and IVC. Tract was dilated to accommodate 10 French sheath. Five French catheter was advanced in the right hepatic vein and hepatic wedge and hepatic free pressures were obtained. Hepatic venography was performed before and after transjugular liver biopsy. Transjugular liver set was advanced through the 10 [place] vascular sheath. Total of 4 transjugular liver biopsies were obtained. Four adequate specimens were obtained and placed in formalin. Vascular sheath was removed with manual compression. Bandage placed over the puncture site. FINDINGS: Transjugular liver biopsies were performed through the right hepatic vein. Four adequate core biopsies were obtained. Hepatic venous pressures: 1. Wedge = 25, Free = 8,  gradient = 17 2. Wedge = 21, Free = 8, gradient = 13 IMPRESSION: 1. Successful transjugular liver biopsy. 2. Hepatic venous pressure gradient is elevated. Findings are suggestive for portal hypertension. Electronically Signed   By: Markus Daft M.D.   On: 07/10/2019 16:10   IR US Guide Vasc Access Right  Result Date: 07/10/2019 INDICATION: 39 year old with alcoholic liver disease. Request for transjugular liver biopsy. EXAM: 1. TRANSJUGULAR LIVER BIOPSY WITH FLUOROSCOPY 2. ULTRASOUND GUIDANCE FOR VASCULAR ACCESS 3. HEPATIC VENOGRAPHY WITH HEPATIC PRESSURES MEDICATIONS: None. ANESTHESIA/SEDATION: Moderate (conscious) sedation was employed during this procedure. A total of Versed 2.0 mg and Fentanyl 100 mcg was administered intravenously. Moderate Sedation Time: 29 minutes. The patient's level of consciousness and vital signs were monitored continuously by radiology nursing throughout the procedure under my direct supervision. FLUOROSCOPY TIME:  Fluoroscopy Time: 4 minutes, 18 seconds, 82 mGy CONTRAST:  25 mL Omnipaque [id number] COMPLICATIONS: None immediate. PROCEDURE: Informed written consent was obtained from the patient after a thorough discussion of the procedural risks, benefits and alternatives. All questions were addressed. Maximal Sterile Barrier Technique was utilized including caps, mask, sterile gowns, sterile gloves, sterile drape, hand hygiene and skin antiseptic. A timeout was performed prior to the initiation of the procedure. Ultrasound confirmed a patent right internal jugular vein. Ultrasound image was saved for documentation. Right neck was prepped and draped in sterile fashion. Skin was anesthetized with 1% lidocaine. Small incision was made. Using ultrasound guidance, 21 gauge needle was directed into the right internal jugular vein and micropuncture dilator set was placed. Bentson wire was advanced into the SVC and IVC. Tract was dilated to accommodate 10 French sheath. Five French catheter was  advanced in the right hepatic vein and hepatic wedge and hepatic free pressures were obtained. Hepatic venography was performed before and after transjugular liver biopsy. Transjugular liver set was advanced through the 10 [place] vascular sheath. Total of 4 transjugular liver biopsies were obtained. Four adequate specimens were obtained and placed in formalin. Vascular sheath was removed with manual compression. Bandage placed over the puncture site. FINDINGS: Transjugular liver biopsies were performed through the right hepatic vein. Four adequate core biopsies were obtained. Hepatic venous pressures: 1. Wedge = 25, Free = 8, gradient = 17 2. Wedge = 21, Free = 8, gradient = 13 IMPRESSION: 1. Successful transjugular liver biopsy. 2. Hepatic venous pressure gradient is elevated. Findings are suggestive for portal hypertension. Electronically Signed   By: Markus Daft M.D.   On: 07/10/2019 16:10      Carmell Austria, MD  08/07/2019, 10:07 AM  Cc: No ref. provider found

## 2019-08-25 ENCOUNTER — Telehealth: Payer: Self-pay | Admitting: General Surgery

## 2019-08-25 NOTE — Telephone Encounter (Signed)
Frances Williams completed FMLA/disability forms. If the patient has any questions in regards to the form and its completion she will need to make an appointment with [facility].

## 2019-08-27 ENCOUNTER — Other Ambulatory Visit: Payer: Self-pay | Admitting: Nurse Practitioner

## 2019-08-27 DIAGNOSIS — K7030 Alcoholic cirrhosis of liver without ascites: Secondary | ICD-10-CM

## 2019-09-01 ENCOUNTER — Other Ambulatory Visit: Payer: Self-pay | Admitting: Nurse Practitioner

## 2019-09-01 DIAGNOSIS — K7030 Alcoholic cirrhosis of liver without ascites: Secondary | ICD-10-CM

## 2019-09-08 NOTE — Telephone Encounter (Signed)
Frances Williams, patient will need to have her labs repeated. She had hypokalemia so Dr. Lyndel Safe prescribed KCL po when he saw her in the office on 08/04/2019. She is requesting a refill of Spironolactone. Looks like Dr. Lyndel Safe ordered repeat labs to be done prior to her abdominal MRI due August 2021. Please have her do the labs asap as her potassium level needs to be rechecked (she is potentially at risk for hyperkalemia when taking Spironolactone + KCL po).  Ok to refill Spironolactone 50mg  one tab po daily # 7 tabs only, will adjust RX once labs are back. Thx

## 2019-09-08 NOTE — Telephone Encounter (Signed)
Correction Dr. Lyndel Safe saw patient in office on 7/1 not 6/28.

## 2019-09-09 ENCOUNTER — Other Ambulatory Visit (INDEPENDENT_AMBULATORY_CARE_PROVIDER_SITE_OTHER): Payer: 59

## 2019-09-09 DIAGNOSIS — K7460 Unspecified cirrhosis of liver: Secondary | ICD-10-CM

## 2019-09-09 LAB — CBC WITH DIFFERENTIAL/PLATELET
Basophils Absolute: 0.1 10*3/uL (ref 0.0–0.1)
Basophils Relative: 1.2 % (ref 0.0–3.0)
Eosinophils Absolute: 0.1 10*3/uL (ref 0.0–0.7)
Eosinophils Relative: 2.7 % (ref 0.0–5.0)
HCT: 31.6 % — ABNORMAL LOW (ref 36.0–46.0)
Hemoglobin: 10.9 g/dL — ABNORMAL LOW (ref 12.0–15.0)
Lymphocytes Relative: 40.5 % (ref 12.0–46.0)
Lymphs Abs: 2.2 10*3/uL (ref 0.7–4.0)
MCHC: 34.4 g/dL (ref 30.0–36.0)
MCV: 92.4 fl (ref 78.0–100.0)
Monocytes Absolute: 0.4 10*3/uL (ref 0.1–1.0)
Monocytes Relative: 6.9 % (ref 3.0–12.0)
Neutro Abs: 2.6 10*3/uL (ref 1.4–7.7)
Neutrophils Relative %: 48.7 % (ref 43.0–77.0)
Platelets: 138 10*3/uL — ABNORMAL LOW (ref 150.0–400.0)
RBC: 3.42 Mil/uL — ABNORMAL LOW (ref 3.87–5.11)
RDW: 18.7 % — ABNORMAL HIGH (ref 11.5–15.5)
WBC: 5.3 10*3/uL (ref 4.0–10.5)

## 2019-09-09 LAB — COMPREHENSIVE METABOLIC PANEL
ALT: 45 U/L — ABNORMAL HIGH (ref 0–35)
AST: 59 U/L — ABNORMAL HIGH (ref 0–37)
Albumin: 4 g/dL (ref 3.5–5.2)
Alkaline Phosphatase: 116 U/L (ref 39–117)
BUN: 15 mg/dL (ref 6–23)
CO2: 29 mEq/L (ref 19–32)
Calcium: 9.8 mg/dL (ref 8.4–10.5)
Chloride: 102 mEq/L (ref 96–112)
Creatinine, Ser: 0.77 mg/dL (ref 0.40–1.20)
GFR: 84.4 mL/min (ref >60.00)
Glucose, Bld: 85 mg/dL (ref 70–99)
Potassium: 3.9 mEq/L (ref 3.5–5.1)
Sodium: 135 mEq/L (ref 135–145)
Total Bilirubin: 3.8 mg/dL — ABNORMAL HIGH (ref 0.2–1.2)
Total Protein: 7.5 g/dL (ref 6.0–8.3)

## 2019-09-09 LAB — PROTIME-INR
INR: 1.5 ratio — ABNORMAL HIGH (ref 0.8–1.0)
Prothrombin Time: 16.8 s — ABNORMAL HIGH (ref 9.6–13.1)

## 2019-09-09 NOTE — Telephone Encounter (Signed)
Frances Williams I have spoken with the patient. She will come in tomorrow for the labs she is to have. The MRI is on 10/06/19. The labs will be good for the MRI. She has been taking the potassium for 30 days now with the Spironolactone. I explained to her the reason for what we are checking with her labs.  Her follow up will be in October with Dr Frances Williams. I will get her scheduled for that as well when I tell her tomorrow about the labs. I am going to hold off on send the script in until we know so she will not have to pay a dispensing fee twice. Is this okay with you?

## 2019-09-10 LAB — AFP TUMOR MARKER: AFP-Tumor Marker: 6 ng/mL

## 2019-09-11 ENCOUNTER — Other Ambulatory Visit: Payer: Self-pay | Admitting: Nurse Practitioner

## 2019-09-11 ENCOUNTER — Other Ambulatory Visit: Payer: Self-pay | Admitting: Gastroenterology

## 2019-09-11 DIAGNOSIS — K7460 Unspecified cirrhosis of liver: Secondary | ICD-10-CM

## 2019-09-11 DIAGNOSIS — K7030 Alcoholic cirrhosis of liver without ascites: Secondary | ICD-10-CM

## 2019-09-11 MED ORDER — SPIRONOLACTONE 50 MG PO TABS: 50.0000 mg | ORAL_TABLET | Freq: Every day | ORAL | 1 refills | Status: DC

## 2019-09-15 ENCOUNTER — Other Ambulatory Visit: Payer: Self-pay

## 2019-09-15 NOTE — Telephone Encounter (Signed)
Please advise Sir, thank you. 

## 2019-09-16 ENCOUNTER — Other Ambulatory Visit: Payer: Self-pay | Admitting: Gastroenterology

## 2019-09-25 ENCOUNTER — Other Ambulatory Visit (INDEPENDENT_AMBULATORY_CARE_PROVIDER_SITE_OTHER): Payer: 59

## 2019-09-25 DIAGNOSIS — K7460 Unspecified cirrhosis of liver: Secondary | ICD-10-CM | POA: Diagnosis not present

## 2019-09-25 DIAGNOSIS — K7030 Alcoholic cirrhosis of liver without ascites: Secondary | ICD-10-CM | POA: Diagnosis not present

## 2019-09-25 LAB — CBC WITH DIFFERENTIAL/PLATELET
Basophils Absolute: 0.1 10*3/uL (ref 0.0–0.1)
Basophils Relative: 2 % (ref 0.0–3.0)
Eosinophils Absolute: 0.2 10*3/uL (ref 0.0–0.7)
Eosinophils Relative: 3.2 % (ref 0.0–5.0)
HCT: 31 % — ABNORMAL LOW (ref 36.0–46.0)
Hemoglobin: 10.7 g/dL — ABNORMAL LOW (ref 12.0–15.0)
Lymphocytes Relative: 30.6 % (ref 12.0–46.0)
Lymphs Abs: 2 10*3/uL (ref 0.7–4.0)
MCHC: 34.4 g/dL (ref 30.0–36.0)
MCV: 94.3 fl (ref 78.0–100.0)
Monocytes Absolute: 0.6 10*3/uL (ref 0.1–1.0)
Monocytes Relative: 8.9 % (ref 3.0–12.0)
Neutro Abs: 3.6 10*3/uL (ref 1.4–7.7)
Neutrophils Relative %: 55.3 % (ref 43.0–77.0)
Platelets: 140 10*3/uL — ABNORMAL LOW (ref 150.0–400.0)
RBC: 3.29 Mil/uL — ABNORMAL LOW (ref 3.87–5.11)
RDW: 18.3 % — ABNORMAL HIGH (ref 11.5–15.5)
WBC: 6.5 10*3/uL (ref 4.0–10.5)

## 2019-09-25 LAB — HEPATIC FUNCTION PANEL
ALT: 54 U/L — ABNORMAL HIGH (ref 0–35)
AST: 73 U/L — ABNORMAL HIGH (ref 0–37)
Albumin: 4.2 g/dL (ref 3.5–5.2)
Alkaline Phosphatase: 143 U/L — ABNORMAL HIGH (ref 39–117)
Bilirubin, Direct: 0.7 mg/dL — ABNORMAL HIGH (ref 0.0–0.3)
Total Bilirubin: 3.5 mg/dL — ABNORMAL HIGH (ref 0.2–1.2)
Total Protein: 7.9 g/dL (ref 6.0–8.3)

## 2019-09-25 LAB — PROTIME-INR
INR: 1.5 ratio — ABNORMAL HIGH (ref 0.8–1.0)
Prothrombin Time: 16.4 s — ABNORMAL HIGH (ref 9.6–13.1)

## 2019-09-29 ENCOUNTER — Other Ambulatory Visit: Payer: Self-pay | Admitting: Gastroenterology

## 2019-09-29 DIAGNOSIS — K7030 Alcoholic cirrhosis of liver without ascites: Secondary | ICD-10-CM

## 2019-10-01 ENCOUNTER — Other Ambulatory Visit: Payer: Self-pay | Admitting: Nurse Practitioner

## 2019-10-05 MED ORDER — POTASSIUM CHLORIDE ER 20 MEQ PO TBCR: 20.0000 meq | EXTENDED_RELEASE_TABLET | Freq: Every day | ORAL | 0 refills | Status: DC

## 2019-10-06 ENCOUNTER — Ambulatory Visit (HOSPITAL_COMMUNITY)
Admission: RE | Admit: 2019-10-06 | Discharge: 2019-10-06 | Disposition: A | Discharge status: Home / Self Care | Payer: BC Managed Care – PPO | Source: Ambulatory Visit | Attending: Gastroenterology | Admitting: Gastroenterology

## 2019-10-06 ENCOUNTER — Other Ambulatory Visit: Payer: Self-pay

## 2019-10-06 DIAGNOSIS — K7460 Unspecified cirrhosis of liver: Secondary | ICD-10-CM | POA: Diagnosis not present

## 2019-10-06 MED ORDER — GADOBUTROL 1 MMOL/ML IV SOLN
8.0000 mL | Freq: Once | INTRAVENOUS | Status: AC | PRN
  Administered 2019-10-06: 7 mL via INTRAVENOUS

## 2019-10-19 ENCOUNTER — Ambulatory Visit: Payer: BC Managed Care – PPO | Attending: Internal Medicine

## 2019-10-19 ENCOUNTER — Other Ambulatory Visit: Payer: Self-pay

## 2019-10-19 DIAGNOSIS — Z23 Encounter for immunization: Secondary | ICD-10-CM

## 2019-10-19 NOTE — Progress Notes (Signed)
   Covid-19 Vaccination Clinic  Name:  KYANNAH CLIMER    MRN: [id number] DOB: Jun 12, 1980  10/19/2019  Ms. Yazdi was observed post Covid-19 immunization for 15 minutes without incident. She was provided with Vaccine Information Sheet and instruction to access the V-Safe system.   Ms. Mcmanigal was instructed to call [phone] with any severe reactions post vaccine: Marland Kitchen Difficulty breathing  . Swelling of face and throat  . A fast heartbeat  . A bad rash all over body  . Dizziness and weakness

## 2019-10-29 ENCOUNTER — Other Ambulatory Visit: Payer: Self-pay | Admitting: Cardiology

## 2019-10-29 ENCOUNTER — Other Ambulatory Visit: Payer: Self-pay | Admitting: Nurse Practitioner

## 2019-10-29 DIAGNOSIS — K7030 Alcoholic cirrhosis of liver without ascites: Secondary | ICD-10-CM

## 2019-11-04 ENCOUNTER — Telehealth: Payer: Self-pay | Admitting: General Surgery

## 2019-11-04 DIAGNOSIS — K7030 Alcoholic cirrhosis of liver without ascites: Secondary | ICD-10-CM

## 2019-11-04 MED ORDER — RIFAXIMIN 550 MG PO TABS: 550.0000 mg | ORAL_TABLET | Freq: Two times a day (BID) | ORAL | 1 refills | Status: DC

## 2019-11-04 NOTE — Telephone Encounter (Signed)
Rx refill request for Xifaxin sent from [facility]. I see no notes as to the patient being on this long term. Does this need to be sent for continuance of thereapy?

## 2019-11-04 NOTE — Telephone Encounter (Signed)
Frances Williams, she will stay on Xifaxan indefinitely for now due to prior hepatic encephalopathy. Ok to refill her Xifaxan RX 550mg  one po bid # 60, 1 additional refill. Pls make sure she has a follow up appt with Dr. Lyndel Safe or with me in Oct. 2021. Thx

## 2019-11-04 NOTE — Telephone Encounter (Signed)
Sent rx to [facility] for Xifaxin 550 mg 1 bid #60 1 refill per [facility]

## 2019-11-13 ENCOUNTER — Other Ambulatory Visit: Payer: Self-pay | Admitting: Nurse Practitioner

## 2019-11-13 ENCOUNTER — Other Ambulatory Visit: Payer: Self-pay | Admitting: Gastroenterology

## 2019-11-13 DIAGNOSIS — K7030 Alcoholic cirrhosis of liver without ascites: Secondary | ICD-10-CM

## 2019-11-17 ENCOUNTER — Other Ambulatory Visit (INDEPENDENT_AMBULATORY_CARE_PROVIDER_SITE_OTHER): Payer: BC Managed Care – PPO

## 2019-11-17 ENCOUNTER — Telehealth: Payer: Self-pay | Admitting: Gastroenterology

## 2019-11-17 DIAGNOSIS — K7030 Alcoholic cirrhosis of liver without ascites: Secondary | ICD-10-CM | POA: Diagnosis not present

## 2019-11-17 LAB — COMPREHENSIVE METABOLIC PANEL
ALT: 39 U/L — ABNORMAL HIGH (ref 0–35)
AST: 56 U/L — ABNORMAL HIGH (ref 0–37)
Albumin: 4.1 g/dL (ref 3.5–5.2)
Alkaline Phosphatase: 105 U/L (ref 39–117)
BUN: 15 mg/dL (ref 6–23)
CO2: 27 mEq/L (ref 19–32)
Calcium: 9.5 mg/dL (ref 8.4–10.5)
Chloride: 102 mEq/L (ref 96–112)
Creatinine, Ser: 0.8 mg/dL (ref 0.40–1.20)
GFR: 94.13 mL/min (ref >60.00)
Glucose, Bld: 77 mg/dL (ref 70–99)
Potassium: 3.5 mEq/L (ref 3.5–5.1)
Sodium: 137 mEq/L (ref 135–145)
Total Bilirubin: 4 mg/dL — ABNORMAL HIGH (ref 0.2–1.2)
Total Protein: 7.6 g/dL (ref 6.0–8.3)

## 2019-11-17 LAB — CBC WITH DIFFERENTIAL/PLATELET
Basophils Absolute: 0 10*3/uL (ref 0.0–0.1)
Basophils Relative: 0.6 % (ref 0.0–3.0)
Eosinophils Absolute: 0.1 10*3/uL (ref 0.0–0.7)
Eosinophils Relative: 3 % (ref 0.0–5.0)
HCT: 34 % — ABNORMAL LOW (ref 36.0–46.0)
Hemoglobin: 11.7 g/dL — ABNORMAL LOW (ref 12.0–15.0)
Lymphocytes Relative: 36.2 % (ref 12.0–46.0)
Lymphs Abs: 1.6 10*3/uL (ref 0.7–4.0)
MCHC: 34.4 g/dL (ref 30.0–36.0)
MCV: 97 fl (ref 78.0–100.0)
Monocytes Absolute: 0.3 10*3/uL (ref 0.1–1.0)
Monocytes Relative: 5.9 % (ref 3.0–12.0)
Neutro Abs: 2.5 10*3/uL (ref 1.4–7.7)
Neutrophils Relative %: 54.3 % (ref 43.0–77.0)
Platelets: 138 10*3/uL — ABNORMAL LOW (ref 150.0–400.0)
RBC: 3.5 Mil/uL — ABNORMAL LOW (ref 3.87–5.11)
RDW: 15.1 % (ref 11.5–15.5)
WBC: 4.5 10*3/uL (ref 4.0–10.5)

## 2019-11-17 LAB — PROTIME-INR
INR: 1.5 ratio — ABNORMAL HIGH (ref 0.8–1.0)
Prothrombin Time: 16.4 s — ABNORMAL HIGH (ref 9.6–13.1)

## 2019-11-17 NOTE — Telephone Encounter (Signed)
A message was left for patient that Dr Lyndel Safe has not reviewed her labs yet to determine if there will be any changes to her medication. Our office will contact her with any recommendations.

## 2019-11-17 NOTE — Telephone Encounter (Signed)
Pt called to inform that she had labs done this morning as she needed it to be able to get rf for spirolactone.

## 2019-11-18 ENCOUNTER — Telehealth: Payer: Self-pay | Admitting: Cardiology

## 2019-11-18 ENCOUNTER — Other Ambulatory Visit: Payer: Self-pay | Admitting: Nurse Practitioner

## 2019-11-18 DIAGNOSIS — K7030 Alcoholic cirrhosis of liver without ascites: Secondary | ICD-10-CM

## 2019-11-18 NOTE — Telephone Encounter (Signed)
Frances Williams is calling stating her AVS from her last appointment advises her she is needing an Echo, but there is no active request. Please advise.

## 2019-11-18 NOTE — Telephone Encounter (Signed)
[facility] to refill aldactone 50mg  once daily?  Pt had labs Monday, 10-11. Thank you

## 2019-11-19 ENCOUNTER — Other Ambulatory Visit: Payer: Self-pay | Admitting: Gastroenterology

## 2019-11-19 DIAGNOSIS — K7030 Alcoholic cirrhosis of liver without ascites: Secondary | ICD-10-CM

## 2019-11-19 MED ORDER — SPIRONOLACTONE 50 MG PO TABS: 50.0000 mg | ORAL_TABLET | Freq: Every day | ORAL | 1 refills | Status: DC

## 2019-11-19 NOTE — Telephone Encounter (Signed)
Informed patient that a refill for Aldactone has been sent to her pharmacy. She will need an appointment with Dr Lyndel Safe for further refills. We will contact her with lab results once Dr Lyndel Safe reviews.

## 2019-11-19 NOTE — Telephone Encounter (Signed)
Pt is checking on the status of lab results

## 2019-11-24 ENCOUNTER — Telehealth: Payer: Self-pay | Admitting: Gastroenterology

## 2019-11-24 NOTE — Telephone Encounter (Signed)
Pt is returning call to discuss lab results.

## 2019-11-26 DIAGNOSIS — I10 Essential (primary) hypertension: Secondary | ICD-10-CM | POA: Insufficient documentation

## 2019-11-26 DIAGNOSIS — F1010 Alcohol abuse, uncomplicated: Secondary | ICD-10-CM | POA: Insufficient documentation

## 2019-11-26 DIAGNOSIS — D496 Neoplasm of unspecified behavior of brain: Secondary | ICD-10-CM | POA: Insufficient documentation

## 2019-11-26 DIAGNOSIS — N189 Chronic kidney disease, unspecified: Secondary | ICD-10-CM | POA: Insufficient documentation

## 2019-11-26 DIAGNOSIS — R569 Unspecified convulsions: Secondary | ICD-10-CM | POA: Insufficient documentation

## 2019-11-28 DIAGNOSIS — Z0189 Encounter for other specified special examinations: Secondary | ICD-10-CM | POA: Diagnosis not present

## 2019-11-28 DIAGNOSIS — Z23 Encounter for immunization: Secondary | ICD-10-CM | POA: Diagnosis not present

## 2019-12-01 ENCOUNTER — Ambulatory Visit (INDEPENDENT_AMBULATORY_CARE_PROVIDER_SITE_OTHER): Payer: BC Managed Care – PPO | Admitting: Cardiology

## 2019-12-01 ENCOUNTER — Encounter: Payer: Self-pay | Admitting: Cardiology

## 2019-12-01 ENCOUNTER — Other Ambulatory Visit: Payer: Self-pay

## 2019-12-01 VITALS — BP 104/52 | HR 76 | Ht 68.0 in | Wt 155.0 lb

## 2019-12-01 DIAGNOSIS — I252 Old myocardial infarction: Secondary | ICD-10-CM

## 2019-12-01 DIAGNOSIS — K709 Alcoholic liver disease, unspecified: Secondary | ICD-10-CM

## 2019-12-01 DIAGNOSIS — I10 Essential (primary) hypertension: Secondary | ICD-10-CM | POA: Diagnosis not present

## 2019-12-01 NOTE — Addendum Note (Signed)
Addended by: Senaida Ores on: 12/01/2019 11:22 AM   Modules accepted: Orders

## 2019-12-01 NOTE — Patient Instructions (Signed)
Medication Instructions:  °Your physician recommends that you continue on your current medications as directed. Please refer to the Current Medication list given to you today. ° °*If you need a refill on your cardiac medications before your next appointment, please call your pharmacy* ° ° °Lab Work: °None. ° °If you have labs (blood work) drawn today and your tests are completely normal, you will receive your results only by: °• MyChart Message (if you have MyChart) OR °• A paper copy in the mail °If you have any lab test that is abnormal or we need to change your treatment, we will call you to review the results. ° ° °Testing/Procedures: °Your physician has requested that you have an echocardiogram. Echocardiography is a painless test that uses sound waves to create images of your heart. It provides your doctor with information about the size and shape of your heart and how well your heart’s chambers and valves are working. This procedure takes approximately one hour. There are no restrictions for this procedure. ° ° ° ° °Follow-Up: °At [facility], you and your health needs are our priority.  As part of our continuing mission to provide you with exceptional heart care, we have created designated Provider Care Teams.  These Care Teams include your primary Cardiologist (physician) and Advanced Practice Providers (APPs -  Physician Assistants and Nurse Practitioners) who all work together to provide you with the care you need, when you need it. ° °We recommend signing up for the patient portal called "MyChart".  Sign up information is provided on this After Visit Summary.  MyChart is used to connect with patients for Virtual Visits (Telemedicine).  Patients are able to view lab/test results, encounter notes, upcoming appointments, etc.  Non-urgent messages can be sent to your provider as well.   °To learn more about what you can do with MyChart, go to [email or web address].com.   ° °Your next appointment:   °6  month(s) ° °The format for your next appointment:   °In Person ° °Provider:   °Robert Krasowski, MD ° ° °Other Instructions ° ° °Echocardiogram °An echocardiogram is a procedure that uses painless sound waves (ultrasound) to produce an image of the heart. Images from an echocardiogram can provide important information about: °· Signs of coronary artery disease (CAD). °· Aneurysm detection. An aneurysm is a weak or damaged part of an artery wall that bulges out from the normal force of blood pumping through the body. °· Heart size and shape. Changes in the size or shape of the heart can be associated with certain conditions, including heart failure, aneurysm, and CAD. °· Heart muscle function. °· Heart valve function. °· Signs of a past heart attack. °· Fluid buildup around the heart. °· Thickening of the heart muscle. °· A tumor or infectious growth around the heart valves. °Tell a health care provider about: °· Any allergies you have. °· All medicines you are taking, including vitamins, herbs, eye drops, creams, and over-the-counter medicines. °· Any blood disorders you have. °· Any surgeries you have had. °· Any medical conditions you have. °· Whether you are pregnant or may be pregnant. °What are the risks? °Generally, this is a safe procedure. However, problems may occur, including: °· Allergic reaction to dye (contrast) that may be used during the procedure. °What happens before the procedure? °No specific preparation is needed. You may eat and drink normally. °What happens during the procedure? ° °· An IV tube may be inserted into one of your veins. °· You may   receive contrast through this tube. A contrast is an injection that improves the quality of the pictures from your heart. °· A gel will be applied to your chest. °· A wand-like tool (transducer) will be moved over your chest. The gel will help to transmit the sound waves from the transducer. °· The sound waves will harmlessly bounce off of your heart to  allow the heart images to be captured in real-time motion. The images will be recorded on a computer. °The procedure may vary among health care providers and hospitals. °What happens after the procedure? °· You may return to your normal, everyday life, including diet, activities, and medicines, unless your health care provider tells you not to do that. °Summary °· An echocardiogram is a procedure that uses painless sound waves (ultrasound) to produce an image of the heart. °· Images from an echocardiogram can provide important information about the size and shape of your heart, heart muscle function, heart valve function, and fluid buildup around your heart. °· You do not need to do anything to prepare before this procedure. You may eat and drink normally. °· After the echocardiogram is completed, you may return to your normal, everyday life, unless your health care provider tells you not to do that. °This information is not intended to replace advice given to you by your health care provider. Make sure you discuss any questions you have with your health care provider. °Document Revised: 05/16/2018 Document Reviewed: 02/26/2016 °Elsevier Patient Education © 2020 Elsevier Inc. ° ° °

## 2019-12-01 NOTE — Progress Notes (Signed)
Cardiology Office Note:    Date:  12/01/2019   ID:  Frances Williams, DOB October 26, 1980, MRN [id number]  PCP:  Glenis Smoker, MD  Cardiologist:  Jenne Campus, MD    Referring MD: No ref. provider found   No chief complaint on file. I am doing fine  History of Present Illness:    Frances Williams is a 39 y.o. female with past medical history significant for alcoholism, cirrhosis of the liver.  She came today to my office to follow-up on her history of myocardial infarction type II which happened in the face of severe GI bleeding, anemia and hypotension.  She did have abnormal troponin I.  She comes today 2 months for follow-up and she is doing very well.  She quit drinking she quit smoking she also got married she went to rehab to help her to quit smoking and drinking and she is staying clean doing overall very well.  He is moving to [place] with her husband fine job.  However, she still want to maintain her physicians.  Denies have any chest pain tightness squeezing pressure burning chest no dizziness no swelling of lower extremities doing well overall.  Past Medical History:  Diagnosis Date  . Alcohol abuse   . Alcoholic liver disease ([place]) 05/16/2019  . Brain tumor ([place])    BENIGN REMOVAL JULY 2001  . Chronic kidney disease   . Hematemesis 05/16/2019  . Hepatomegaly 05/16/2019  . History of MI (myocardial infarction) 05/20/2019  . Hypertension   . Seizure ([place])   . Smoking history 07/21/2019    Past Surgical History:  Procedure Laterality Date  . BRAIN SURGERY     2001 on temporal lobe  . ESOPHAGOGASTRODUODENOSCOPY  05/02/2019   [facility] [place]  . IR TRANSCATHETER BX  07/10/2019  . IR US GUIDE VASC ACCESS RIGHT  07/10/2019  . IR VENOGRAM HEPATIC W HEMODYNAMIC EVALUATION  07/10/2019    Current Medications: Current Meds  Medication Sig  . B Complex Vitamins (B COMPLEX-B12 PO) Take by mouth daily.  Marland Kitchen escitalopram (LEXAPRO) 20 MG tablet Take 20 mg by mouth daily.  .  folic acid (FOLVITE) 1 MG tablet TAKE ONE TABLET DAILY  . furosemide (LASIX) 20 MG tablet TAKE ONE TABLET DAILY  . lactulose (CHRONULAC) 10 GM/15ML solution TAKE 30ML TWICE DAILY  . metoprolol tartrate (LOPRESSOR) 25 MG tablet TAKE 1/2 TABLET TWICE DAILY  . Multiple Vitamins-Minerals (ONE-A-DAY WOMENS PO) Take by mouth daily.  . pantoprazole (PROTONIX) 40 MG tablet TAKE ONE TABLET TWICE DAILY  . potassium chloride SA (KLOR-CON) 20 MEQ tablet TAKE ONE TABLET DAILY  . rifaximin (XIFAXAN) 550 MG TABS tablet Take 1 tablet (550 mg total) by mouth 2 (two) times daily.  Marland Kitchen spironolactone (ALDACTONE) 50 MG tablet TAKE ONE TABLET EACH DAY     Allergies:   Patient has no known allergies.   Social History   Socioeconomic History  . Marital status: Married    Spouse name: Not on file  . Number of children: Not on file  . Years of education: Not on file  . Highest education level: Not on file  Occupational History  . Not on file  Tobacco Use  . Smoking status: Former Research scientist (life sciences)  . Smokeless tobacco: Never Used  . Tobacco comment: quit 05/01/2019  Vaping Use  . Vaping Use: Former  Substance and Sexual Activity  . Alcohol use: Not Currently    Comment: quit 05/01/2019  . Drug use: Not Currently  Types: "Crack" cocaine, Marijuana  . Sexual activity: Not on file  Other Topics Concern  . Not on file  Social History Narrative  . Not on file   Social Determinants of Health   Financial Resource Strain:   . Difficulty of Paying Living Expenses: Not on file  Food Insecurity:   . Worried About Charity fundraiser in the Last Year: Not on file  . Ran Out of Food in the Last Year: Not on file  Transportation Needs:   . Lack of Transportation (Medical): Not on file  . Lack of Transportation (Non-Medical): Not on file  Physical Activity:   . Days of Exercise per Week: Not on file  . Minutes of Exercise per Session: Not on file  Stress:   . Feeling of Stress : Not on file  Social Connections:     . Frequency of Communication with Friends and Family: Not on file  . Frequency of Social Gatherings with Friends and Family: Not on file  . Attends Religious Services: Not on file  . Active Member of Clubs or Organizations: Not on file  . Attends Archivist Meetings: Not on file  . Marital Status: Not on file     Family History: The patient's family history includes Breast cancer (age of onset: 64) in her mother; Ovarian cancer in her paternal grandmother. There is no history of Colon cancer or Esophageal cancer. ROS:   Please see the history of present illness.    All 14 point review of systems negative except as described per history of present illness  EKGs/Labs/Other Studies Reviewed:      Recent Labs: 05/16/2019: TSH 3.87 11/17/2019: ALT 39; BUN 15; Creatinine, Ser 0.80; Hemoglobin 11.7; Platelets 138.0 Repeated and verified X2.; Potassium 3.5; Sodium 137  Recent Lipid Panel No results found for: CHOL, TRIG, HDL, CHOLHDL, VLDL, LDLCALC, LDLDIRECT  Physical Exam:    VS:  BP (!) 104/52   Pulse 76   Ht 5\' 8"  (1.727 m)   Wt 155 lb (70.3 kg)   SpO2 97%   BMI 23.57 kg/m     Wt Readings from Last 3 Encounters:  12/01/19 155 lb (70.3 kg)  08/07/19 156 lb (70.8 kg)  07/21/19 161 lb (73 kg)     GEN:  Well nourished, well developed in no acute distress HEENT: Normal NECK: No JVD; No carotid bruits LYMPHATICS: No lymphadenopathy CARDIAC: RRR, no murmurs, no rubs, no gallops RESPIRATORY:  Clear to auscultation without rales, wheezing or rhonchi  ABDOMEN: Soft, non-tender, non-distended MUSCULOSKELETAL:  No edema; No deformity  SKIN: Warm and dry LOWER EXTREMITIES: no swelling NEUROLOGIC:  Alert and oriented x 3 PSYCHIATRIC:  Normal affect   ASSESSMENT:    1. History of MI (myocardial infarction)   2. Alcoholic liver disease ([place])   3. Primary hypertension    PLAN:    In order of problems listed above:  1. History of myocardial infarction which is type  II.  I will ask her to have echocardiogram for some reason it did not happen the purpose is to assess left ventricle ejection fraction. 2. Alcoholic liver disease.  Noted.  Blood pressure quit drinking and does not do any alcohol.  I encouraged her to stay away from it. 3. Essential hypertension: Blood pressure actually is on the lower side. 4. I did review note from GI doctor  for this visit.  Looks like she is doing well from that point review.   Medication Adjustments/Labs and Tests Ordered:  Current medicines are reviewed at length with the patient today.  Concerns regarding medicines are outlined above.  No orders of the defined types were placed in this encounter.  Medication changes: No orders of the defined types were placed in this encounter.   Signed, Park Liter, MD, [facility] 12/01/2019 11:17 AM    [place]

## 2019-12-17 ENCOUNTER — Other Ambulatory Visit: Payer: Self-pay

## 2019-12-17 ENCOUNTER — Ambulatory Visit (HOSPITAL_BASED_OUTPATIENT_CLINIC_OR_DEPARTMENT_OTHER)
Admission: RE | Admit: 2019-12-17 | Discharge: 2019-12-17 | Disposition: A | Discharge status: Home / Self Care | Payer: BC Managed Care – PPO | Source: Ambulatory Visit | Attending: Cardiology | Admitting: Cardiology

## 2019-12-17 DIAGNOSIS — I10 Essential (primary) hypertension: Secondary | ICD-10-CM | POA: Insufficient documentation

## 2019-12-17 LAB — ECHOCARDIOGRAM COMPLETE
Area-P 1/2: 2.02 cm2
S' Lateral: 3.63 cm

## 2019-12-19 ENCOUNTER — Telehealth: Payer: Self-pay | Admitting: Cardiology

## 2019-12-19 NOTE — Telephone Encounter (Signed)
Patient informed of results.  

## 2019-12-19 NOTE — Telephone Encounter (Signed)
Patient returning call for echo results. 

## 2019-12-24 ENCOUNTER — Telehealth: Payer: Self-pay | Admitting: Gastroenterology

## 2019-12-24 NOTE — Telephone Encounter (Signed)
Request for Klor-con refill\prior auth

## 2019-12-25 NOTE — Telephone Encounter (Signed)
Patient is calling to follow up on previous request for refill please advise if anything is needed prior to refill

## 2019-12-26 ENCOUNTER — Other Ambulatory Visit: Payer: Self-pay

## 2019-12-26 ENCOUNTER — Other Ambulatory Visit: Payer: Self-pay | Admitting: Nurse Practitioner

## 2019-12-26 DIAGNOSIS — K7030 Alcoholic cirrhosis of liver without ascites: Secondary | ICD-10-CM

## 2019-12-26 MED ORDER — POTASSIUM CHLORIDE CRYS ER 20 MEQ PO TBCR
20.0000 meq | EXTENDED_RELEASE_TABLET | Freq: Every day | ORAL | 3 refills | Status: DC
Start: 2019-12-26 — End: 2020-06-16

## 2019-12-29 NOTE — Telephone Encounter (Signed)
Would you like to refill this prescription?  

## 2019-12-31 MED ORDER — POTASSIUM CHLORIDE ER 20 MEQ PO TBCR: 1.0000 | EXTENDED_RELEASE_TABLET | Freq: Every day | ORAL | 6 refills | Status: DC

## 2019-12-31 NOTE — Telephone Encounter (Signed)
Please call in K. Dur 20 mEq p.o. once a day, 30, 6 refills RG

## 2019-12-31 NOTE — Telephone Encounter (Signed)
Medication sent to [facility]

## 2020-01-08 ENCOUNTER — Telehealth: Payer: Self-pay | Admitting: Gastroenterology

## 2020-01-08 DIAGNOSIS — K7030 Alcoholic cirrhosis of liver without ascites: Secondary | ICD-10-CM

## 2020-01-08 MED ORDER — FOLIC ACID 1 MG PO TABS
1.0000 mg | ORAL_TABLET | Freq: Every day | ORAL | 3 refills | Status: DC
Start: 2020-01-08 — End: 2020-04-21

## 2020-01-08 MED ORDER — FUROSEMIDE 20 MG PO TABS: 20.0000 mg | ORAL_TABLET | Freq: Every day | ORAL | 3 refills | Status: DC

## 2020-01-08 NOTE — Telephone Encounter (Signed)
Please fill Lasix and folic acid Get CMP, CBC RG

## 2020-01-08 NOTE — Telephone Encounter (Signed)
I have sent patients prescription to pharmacy and left her a message regarding the labs.

## 2020-01-08 NOTE — Telephone Encounter (Signed)
Would you like to refill Furosemide and Folic Acid? Does patient need any labs?

## 2020-01-12 ENCOUNTER — Other Ambulatory Visit: Payer: Self-pay

## 2020-01-12 MED ORDER — METOPROLOL TARTRATE 25 MG PO TABS
12.5000 mg | ORAL_TABLET | Freq: Two times a day (BID) | ORAL | 2 refills | Status: DC
Start: 2020-01-12 — End: 2020-10-18

## 2020-01-12 NOTE — Telephone Encounter (Signed)
Refill sent to [facility] for Metoprolol Tartrate 25 mg.

## 2020-01-17 ENCOUNTER — Other Ambulatory Visit: Payer: Self-pay | Admitting: Gastroenterology

## 2020-01-17 DIAGNOSIS — K7030 Alcoholic cirrhosis of liver without ascites: Secondary | ICD-10-CM

## 2020-01-20 ENCOUNTER — Other Ambulatory Visit (INDEPENDENT_AMBULATORY_CARE_PROVIDER_SITE_OTHER): Payer: BC Managed Care – PPO

## 2020-01-20 DIAGNOSIS — K7030 Alcoholic cirrhosis of liver without ascites: Secondary | ICD-10-CM

## 2020-01-20 LAB — COMPREHENSIVE METABOLIC PANEL
ALT: 35 U/L (ref 0–35)
AST: 49 U/L — ABNORMAL HIGH (ref 0–37)
Albumin: 4.3 g/dL (ref 3.5–5.2)
Alkaline Phosphatase: 136 U/L — ABNORMAL HIGH (ref 39–117)
BUN: 24 mg/dL — ABNORMAL HIGH (ref 6–23)
CO2: 28 mEq/L (ref 19–32)
Calcium: 9.7 mg/dL (ref 8.4–10.5)
Chloride: 97 mEq/L (ref 96–112)
Creatinine, Ser: 0.79 mg/dL (ref 0.40–1.20)
GFR: 95.67 mL/min (ref >60.00)
Glucose, Bld: 83 mg/dL (ref 70–99)
Potassium: 4.3 mEq/L (ref 3.5–5.1)
Sodium: 133 mEq/L — ABNORMAL LOW (ref 135–145)
Total Bilirubin: 3.5 mg/dL — ABNORMAL HIGH (ref 0.2–1.2)
Total Protein: 7.9 g/dL (ref 6.0–8.3)

## 2020-01-20 LAB — CBC WITH DIFFERENTIAL/PLATELET
Basophils Absolute: 0 10*3/uL (ref 0.0–0.1)
Basophils Relative: 0.8 % (ref 0.0–3.0)
Eosinophils Absolute: 0.1 10*3/uL (ref 0.0–0.7)
Eosinophils Relative: 2.6 % (ref 0.0–5.0)
HCT: 35 % — ABNORMAL LOW (ref 36.0–46.0)
Hemoglobin: 12.2 g/dL (ref 12.0–15.0)
Lymphocytes Relative: 38.7 % (ref 12.0–46.0)
Lymphs Abs: 2.2 10*3/uL (ref 0.7–4.0)
MCHC: 34.8 g/dL (ref 30.0–36.0)
MCV: 99.2 fl (ref 78.0–100.0)
Monocytes Absolute: 0.6 10*3/uL (ref 0.1–1.0)
Monocytes Relative: 10.9 % (ref 3.0–12.0)
Neutro Abs: 2.7 10*3/uL (ref 1.4–7.7)
Neutrophils Relative %: 47 % (ref 43.0–77.0)
Platelets: 135 10*3/uL — ABNORMAL LOW (ref 150.0–400.0)
RBC: 3.53 Mil/uL — ABNORMAL LOW (ref 3.87–5.11)
RDW: 15.2 % (ref 11.5–15.5)
WBC: 5.6 10*3/uL (ref 4.0–10.5)

## 2020-01-21 ENCOUNTER — Telehealth: Payer: Self-pay

## 2020-01-21 DIAGNOSIS — K7460 Unspecified cirrhosis of liver: Secondary | ICD-10-CM

## 2020-01-21 DIAGNOSIS — K7030 Alcoholic cirrhosis of liver without ascites: Secondary | ICD-10-CM

## 2020-01-21 NOTE — Telephone Encounter (Signed)
-----   Message from Jackquline Denmark, MD sent at 01/21/2020  8:24 AM EST ----- Please inform the patient. All results normal or at baseline. Repeat CBC, CMP PT/INR in 6 weeks Send report to family physician

## 2020-01-21 NOTE — Telephone Encounter (Signed)
Spoke to patient to inform her of recent lab results. She will have repeat labs done in 6 weeks. All questions answered. Patient voiced understanding

## 2020-01-27 ENCOUNTER — Other Ambulatory Visit: Payer: Self-pay | Admitting: Gastroenterology

## 2020-01-27 DIAGNOSIS — K7030 Alcoholic cirrhosis of liver without ascites: Secondary | ICD-10-CM

## 2020-03-08 ENCOUNTER — Other Ambulatory Visit: Payer: Self-pay | Admitting: Gastroenterology

## 2020-03-08 DIAGNOSIS — K7030 Alcoholic cirrhosis of liver without ascites: Secondary | ICD-10-CM

## 2020-03-09 LAB — CBC WITH DIFFERENTIAL/PLATELET
Basophils Absolute: 0.1 10*3/uL (ref 0.0–0.2)
Basos: 1 %
EOS (ABSOLUTE): 0.1 10*3/uL (ref 0.0–0.4)
Eos: 2 %
Hematocrit: 33.7 % — ABNORMAL LOW (ref 34.0–46.6)
Hemoglobin: 12 g/dL (ref 11.1–15.9)
Immature Grans (Abs): 0 10*3/uL (ref 0.0–0.1)
Immature Granulocytes: 0 %
Lymphocytes Absolute: 2 10*3/uL (ref 0.7–3.1)
Lymphs: 32 %
MCH: 34.1 pg — ABNORMAL HIGH (ref 26.6–33.0)
MCHC: 35.6 g/dL (ref 31.5–35.7)
MCV: 96 fL (ref 79–97)
Monocytes Absolute: 0.5 10*3/uL (ref 0.1–0.9)
Monocytes: 8 %
Neutrophils Absolute: 3.6 10*3/uL (ref 1.4–7.0)
Neutrophils: 57 %
Platelets: 145 10*3/uL — ABNORMAL LOW (ref 150–450)
RBC: 3.52 x10E6/uL — ABNORMAL LOW (ref 3.77–5.28)
RDW: 12.6 % (ref 11.7–15.4)
WBC: 6.3 10*3/uL (ref 3.4–10.8)

## 2020-03-09 LAB — PROTIME-INR
INR: 1.3 — ABNORMAL HIGH (ref 0.9–1.2)
Prothrombin Time: 13.1 s — ABNORMAL HIGH (ref 9.1–12.0)

## 2020-03-09 LAB — COMPREHENSIVE METABOLIC PANEL WITH GFR
ALT: 29 IU/L (ref 0–32)
AST: 53 IU/L — ABNORMAL HIGH (ref 0–40)
Albumin/Globulin Ratio: 1.3 (ref 1.2–2.2)
Albumin: 4.2 g/dL (ref 3.8–4.8)
Alkaline Phosphatase: 143 IU/L — ABNORMAL HIGH (ref 44–121)
BUN/Creatinine Ratio: 24 — ABNORMAL HIGH (ref 9–23)
BUN: 20 mg/dL (ref 6–20)
Bilirubin Total: 3 mg/dL — ABNORMAL HIGH (ref 0.0–1.2)
CO2: 24 mmol/L (ref 20–29)
Calcium: 9.7 mg/dL (ref 8.7–10.2)
Chloride: 100 mmol/L (ref 96–106)
Creatinine, Ser: 0.84 mg/dL (ref 0.57–1.00)
GFR calc Af Amer: 103 mL/min/1.73
GFR calc non Af Amer: 89 mL/min/1.73
Globulin, Total: 3.2 g/dL (ref 1.5–4.5)
Glucose: 102 mg/dL — ABNORMAL HIGH (ref 65–99)
Potassium: 4.4 mmol/L (ref 3.5–5.2)
Sodium: 137 mmol/L (ref 134–144)
Total Protein: 7.4 g/dL (ref 6.0–8.5)

## 2020-03-17 ENCOUNTER — Telehealth: Payer: Self-pay | Admitting: General Surgery

## 2020-03-17 NOTE — Telephone Encounter (Signed)
Left a detailed message on the patients voicemail that the lab results were at baseline and I will forward them on to her PCP.

## 2020-03-17 NOTE — Telephone Encounter (Signed)
-----   Message from Jackquline Denmark, MD sent at 03/16/2020  7:54 PM EST ----- Please inform the patient. All results at baseline. Send report to family physician

## 2020-04-13 ENCOUNTER — Other Ambulatory Visit: Payer: Self-pay | Admitting: Nurse Practitioner

## 2020-04-13 DIAGNOSIS — K7030 Alcoholic cirrhosis of liver without ascites: Secondary | ICD-10-CM

## 2020-04-15 ENCOUNTER — Telehealth: Payer: Self-pay | Admitting: Nurse Practitioner

## 2020-04-15 NOTE — Telephone Encounter (Signed)
Rx sent for 1 month. Needs office visit for further refills.

## 2020-04-15 NOTE — Telephone Encounter (Signed)
Pt is requesting a refill on her Gray

## 2020-04-21 ENCOUNTER — Telehealth: Payer: Self-pay | Admitting: Gastroenterology

## 2020-04-21 DIAGNOSIS — K7030 Alcoholic cirrhosis of liver without ascites: Secondary | ICD-10-CM

## 2020-04-21 MED ORDER — FUROSEMIDE 20 MG PO TABS: 20.0000 mg | ORAL_TABLET | Freq: Every day | ORAL | 2 refills | Status: DC

## 2020-04-21 MED ORDER — FOLIC ACID 1 MG PO TABS: 1.0000 mg | ORAL_TABLET | Freq: Every day | ORAL | 2 refills | Status: DC

## 2020-04-21 MED ORDER — LACTULOSE 10 GM/15ML PO SOLN: ORAL | 1 refills | Status: DC

## 2020-04-21 MED ORDER — PANTOPRAZOLE SODIUM 40 MG PO TBEC: 40.0000 mg | DELAYED_RELEASE_TABLET | Freq: Two times a day (BID) | ORAL | 2 refills | Status: DC

## 2020-04-21 NOTE — Telephone Encounter (Signed)
Appointment scheduled for May 11th and patient was told to keep this appointment

## 2020-04-21 NOTE — Telephone Encounter (Signed)
Patient requesting a refill on Protonix, Lactulose, folic acid and Lasix. [facility] [place] [facility] [place]

## 2020-04-21 NOTE — Telephone Encounter (Signed)
LVM for patient to call back to make an appointment for April or May and medication has been sent with only a 2 month supply and if she doesn't keep this appointment then she wont get any more until seen in office

## 2020-05-17 ENCOUNTER — Other Ambulatory Visit: Payer: Self-pay | Admitting: Gastroenterology

## 2020-05-17 DIAGNOSIS — K7030 Alcoholic cirrhosis of liver without ascites: Secondary | ICD-10-CM

## 2020-05-25 ENCOUNTER — Telehealth: Payer: Self-pay | Admitting: Gastroenterology

## 2020-05-25 DIAGNOSIS — K760 Fatty (change of) liver, not elsewhere classified: Secondary | ICD-10-CM

## 2020-05-25 DIAGNOSIS — K7460 Unspecified cirrhosis of liver: Secondary | ICD-10-CM

## 2020-05-25 NOTE — Telephone Encounter (Signed)
Patient stated that she needs to have lab work done before her appointment. She will get back with me regarding a fax number but Dr Lyndel Safe what labs do you want me to order for this patient?

## 2020-05-25 NOTE — Telephone Encounter (Signed)
Inbound call from patient aware of her appointment 5/11. Stated she will need to have labs before and requested the labs be done in [place] at [facility]. Best contact number for patient [phone]

## 2020-05-25 NOTE — Telephone Encounter (Signed)
Mailbox is full and cant accept any messages

## 2020-05-27 NOTE — Telephone Encounter (Signed)
Just CBC, CMP RG 

## 2020-05-27 NOTE — Telephone Encounter (Signed)
Called patient and said she will get a fax number so I can sent her labs to. Will place order and sent once I have a fax number

## 2020-05-28 NOTE — Addendum Note (Signed)
Addended by: Villa Herb on: 05/28/2020 04:43 PM   Modules accepted: Orders

## 2020-05-28 NOTE — Telephone Encounter (Signed)
Inbound call from patient. Fax number [phone]

## 2020-05-28 NOTE — Telephone Encounter (Signed)
CBC and CMP sent and patient will bring them and ill be on the lookout for them

## 2020-06-09 NOTE — Telephone Encounter (Signed)
Mailbox is full so I couldn't leave vociemail. Trying to see if patient had labwork or not

## 2020-06-09 NOTE — Telephone Encounter (Signed)
And where did she have it done at

## 2020-06-09 NOTE — Telephone Encounter (Signed)
Called [facility] not done since 03/08/2020 and [place] [facility] haven't seen them  [facility] is the fax number. Number [phone] to call

## 2020-06-14 DIAGNOSIS — K7460 Unspecified cirrhosis of liver: Secondary | ICD-10-CM | POA: Diagnosis not present

## 2020-06-14 DIAGNOSIS — K760 Fatty (change of) liver, not elsewhere classified: Secondary | ICD-10-CM | POA: Diagnosis not present

## 2020-06-15 LAB — CBC WITH DIFFERENTIAL/PLATELET
Basophils Absolute: 0 10*3/uL (ref 0.0–0.2)
Basos: 1 %
EOS (ABSOLUTE): 0.3 10*3/uL (ref 0.0–0.4)
Eos: 4 %
Hematocrit: 34.8 % (ref 34.0–46.6)
Hemoglobin: 12.2 g/dL (ref 11.1–15.9)
Immature Grans (Abs): 0 10*3/uL (ref 0.0–0.1)
Immature Granulocytes: 0 %
Lymphocytes Absolute: 2.6 10*3/uL (ref 0.7–3.1)
Lymphs: 42 %
MCH: 35.9 pg — ABNORMAL HIGH (ref 26.6–33.0)
MCHC: 35.1 g/dL (ref 31.5–35.7)
MCV: 102 fL — ABNORMAL HIGH (ref 79–97)
Monocytes Absolute: 0.5 10*3/uL (ref 0.1–0.9)
Monocytes: 7 %
Neutrophils Absolute: 2.9 10*3/uL (ref 1.4–7.0)
Neutrophils: 46 %
Platelets: 193 10*3/uL (ref 150–450)
RBC: 3.4 x10E6/uL — ABNORMAL LOW (ref 3.77–5.28)
RDW: 16.9 % — ABNORMAL HIGH (ref 11.7–15.4)
WBC: 6.3 10*3/uL (ref 3.4–10.8)

## 2020-06-15 LAB — COMPREHENSIVE METABOLIC PANEL
ALT: 27 IU/L (ref 0–32)
AST: 45 IU/L — ABNORMAL HIGH (ref 0–40)
Albumin/Globulin Ratio: 1.6 (ref 1.2–2.2)
Albumin: 4.7 g/dL (ref 3.8–4.8)
Alkaline Phosphatase: 128 IU/L — ABNORMAL HIGH (ref 44–121)
BUN/Creatinine Ratio: 22 (ref 9–23)
BUN: 18 mg/dL (ref 6–20)
Bilirubin Total: 4.6 mg/dL — ABNORMAL HIGH (ref 0.0–1.2)
CO2: 19 mmol/L — ABNORMAL LOW (ref 20–29)
Calcium: 9.6 mg/dL (ref 8.7–10.2)
Chloride: 99 mmol/L (ref 96–106)
Creatinine, Ser: 0.81 mg/dL (ref 0.57–1.00)
Globulin, Total: 3 g/dL (ref 1.5–4.5)
Glucose: 82 mg/dL (ref 65–99)
Potassium: 4.5 mmol/L (ref 3.5–5.2)
Sodium: 136 mmol/L (ref 134–144)
Total Protein: 7.7 g/dL (ref 6.0–8.5)
eGFR: 96 mL/min/{1.73_m2} (ref >59)

## 2020-06-16 ENCOUNTER — Encounter: Payer: Self-pay | Admitting: Cardiology

## 2020-06-16 ENCOUNTER — Ambulatory Visit (INDEPENDENT_AMBULATORY_CARE_PROVIDER_SITE_OTHER): Payer: BC Managed Care – PPO | Admitting: Cardiology

## 2020-06-16 ENCOUNTER — Encounter: Payer: Self-pay | Admitting: Gastroenterology

## 2020-06-16 ENCOUNTER — Other Ambulatory Visit: Payer: Self-pay

## 2020-06-16 ENCOUNTER — Ambulatory Visit (INDEPENDENT_AMBULATORY_CARE_PROVIDER_SITE_OTHER): Payer: BC Managed Care – PPO | Admitting: Gastroenterology

## 2020-06-16 VITALS — BP 108/56 | HR 78 | Ht 68.0 in | Wt 146.0 lb

## 2020-06-16 VITALS — BP 110/68 | HR 110 | Ht 68.0 in | Wt 148.1 lb

## 2020-06-16 DIAGNOSIS — K709 Alcoholic liver disease, unspecified: Secondary | ICD-10-CM

## 2020-06-16 DIAGNOSIS — F1010 Alcohol abuse, uncomplicated: Secondary | ICD-10-CM

## 2020-06-16 DIAGNOSIS — I252 Old myocardial infarction: Secondary | ICD-10-CM | POA: Diagnosis not present

## 2020-06-16 DIAGNOSIS — K7030 Alcoholic cirrhosis of liver without ascites: Secondary | ICD-10-CM

## 2020-06-16 DIAGNOSIS — I10 Essential (primary) hypertension: Secondary | ICD-10-CM

## 2020-06-16 NOTE — Progress Notes (Signed)
Cardiology Office Note:    Date:  06/16/2020   ID:  Frances Williams, DOB 1981-01-24, MRN [id number]  PCP:  Glenis Smoker, MD  Cardiologist:  Jenne Campus, MD    Referring MD: Glenis Smoker, *   Chief Complaint  Patient presents with  . Follow-up  I am doing well  History of Present Illness:    Frances Williams is a 40 y.o. female with past medical history significant for cirrhosis of the liver secondary to alcoholism, now she is sober, also history of type II myocardial infarction in face of severe GI bleeding and anemia and hypotension.  She recently had echocardiogram done which showed preserved left ventricle ejection fraction.  She comes today to my office for follow-up.  Overall she is doing very well.  Denies have any chest pain tightness squeezing pressure burning chest.  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  She is walking a lot have no problems.  She abstain from alcohol.  She also quit smoking.  Past Medical History:  Diagnosis Date  . Alcohol abuse   . Alcoholic liver disease ([place]) 05/16/2019  . Brain tumor ([place])    BENIGN REMOVAL JULY 2001  . Chronic kidney disease   . Hematemesis 05/16/2019  . Hepatomegaly 05/16/2019  . History of MI (myocardial infarction) 05/20/2019  . Hypertension   . Seizure ([place])   . Smoking history 07/21/2019    Past Surgical History:  Procedure Laterality Date  . BRAIN SURGERY     2001 on temporal lobe  . ESOPHAGOGASTRODUODENOSCOPY  05/02/2019   [facility] [place]  . IR TRANSCATHETER BX  07/10/2019  . IR US GUIDE VASC ACCESS RIGHT  07/10/2019  . IR VENOGRAM HEPATIC W HEMODYNAMIC EVALUATION  07/10/2019    Current Medications: Current Meds  Medication Sig  . B Complex Vitamins (B COMPLEX-B12 PO) Take 1 tablet by mouth daily. Unknown strength  . escitalopram (LEXAPRO) 20 MG tablet Take 20 mg by mouth daily.  . folic acid (FOLVITE) 1 MG tablet Take 1 tablet (1 mg total) by mouth daily.  . furosemide  (LASIX) 20 MG tablet Take 1 tablet (20 mg total) by mouth daily.  Marland Kitchen lactulose (CHRONULAC) 10 GM/15ML solution TAKE 30ML TWICE DAILY (Patient taking differently: Take 30 g by mouth 2 (two) times daily. TAKE 30ML TWICE DAILY)  . metoprolol tartrate (LOPRESSOR) 25 MG tablet Take 0.5 tablets (12.5 mg total) by mouth 2 (two) times daily.  . Multiple Vitamins-Minerals (ONE-A-DAY WOMENS PO) Take 1 tablet by mouth daily. Unknown strength  . pantoprazole (PROTONIX) 40 MG tablet Take 1 tablet (40 mg total) by mouth 2 (two) times daily.  . Potassium Chloride ER 20 MEQ TBCR Take 1 tablet by mouth daily.  . rifaximin (XIFAXAN) 550 MG TABS tablet Take 1 tablet (550 mg total) by mouth 2 (two) times daily. NEEDS OFFICE VISIT FOR FURTHER REFILLS  . spironolactone (ALDACTONE) 50 MG tablet TAKE 1 TABLET BY MOUTH DAILY (Patient taking differently: Take 50 mg by mouth daily.)     Allergies:   Patient has no known allergies.   Social History   Socioeconomic History  . Marital status: Married    Spouse name: Not on file  . Number of children: Not on file  . Years of education: Not on file  . Highest education level: Not on file  Occupational History  . Not on file  Tobacco Use  . Smoking status: Former Research scientist (life sciences)  .  Smokeless tobacco: Never Used  . Tobacco comment: quit 05/01/2019  Vaping Use  . Vaping Use: Former  Substance and Sexual Activity  . Alcohol use: Not Currently    Comment: quit 05/01/2019  . Drug use: Not Currently    Types: "Crack" cocaine, Marijuana  . Sexual activity: Not on file  Other Topics Concern  . Not on file  Social History Narrative  . Not on file   Social Determinants of Health   Financial Resource Strain: Not on file  Food Insecurity: Not on file  Transportation Needs: Not on file  Physical Activity: Not on file  Stress: Not on file  Social Connections: Not on file     Family History: The patient's family history includes Breast cancer (age of onset: 88) in her mother;  Ovarian cancer in her paternal grandmother. There is no history of Colon cancer or Esophageal cancer. ROS:   Please see the history of present illness.    All 14 point review of systems negative except as described per history of present illness  EKGs/Labs/Other Studies Reviewed:      Recent Labs: 06/14/2020: ALT 27; BUN 18; Creatinine, Ser 0.81; Hemoglobin 12.2; Platelets 193; Potassium 4.5; Sodium 136  Recent Lipid Panel No results found for: CHOL, TRIG, HDL, CHOLHDL, VLDL, LDLCALC, LDLDIRECT  Physical Exam:    VS:  BP (!) 108/56 (BP Location: Right Arm, Patient Position: Sitting)   Pulse 78   Ht 5\' 8"  (1.727 m)   Wt 146 lb (66.2 kg)   SpO2 94%   BMI 22.20 kg/m     Wt Readings from Last 3 Encounters:  06/16/20 146 lb (66.2 kg)  06/16/20 148 lb 2 oz (67.2 kg)  12/01/19 155 lb (70.3 kg)     GEN:  Well nourished, well developed in no acute distress HEENT: Normal NECK: No JVD; No carotid bruits LYMPHATICS: No lymphadenopathy CARDIAC: RRR, no murmurs, no rubs, no gallops RESPIRATORY:  Clear to auscultation without rales, wheezing or rhonchi  ABDOMEN: Soft, non-tender, non-distended MUSCULOSKELETAL:  No edema; No deformity  SKIN: Warm and dry LOWER EXTREMITIES: no swelling NEUROLOGIC:  Alert and oriented x 3 PSYCHIATRIC:  Normal affect   ASSESSMENT:    1. Primary hypertension   2. History of MI (myocardial infarction)   3. Alcohol abuse   4. Alcoholic liver disease ([place])    PLAN:    In order of problems listed above:  1. Essential hypertension blood pressure well controlled continue present management. 2. History of MI but this is type II, this is related to severe anemia hypertension.  Echocardiogram showed preserved left ventricle ejection fraction. 3. History of alcohol abuse.  She is not drinking anymore. 4. Alcoholic liver disease.  I did review note from gastroenterologist that she did see today.  She is doing well from that point review. 5. We did talk about  healthy lifestyle as well as need for check her cholesterol.  However with her liver problem I be reluctant to put her on statin.  On top of that I do not think her myocardial infarction was related to atherosclerosis.  Regardless 6. When she will have blood work done we will check cholesterol.   Medication Adjustments/Labs and Tests Ordered: Current medicines are reviewed at length with the patient today.  Concerns regarding medicines are outlined above.  Orders Placed This Encounter  Procedures  . Lipid panel  . EKG 12-Lead   Medication changes: No orders of the defined types were placed in this encounter.  Signed, Park Liter, MD, [facility] 06/16/2020 12:05 PM    [place]

## 2020-06-16 NOTE — Progress Notes (Signed)
Chief Complaint:   Referring Provider:  Glenis Smoker, *      ASSESSMENT AND PLAN;   #1.  ETOH Liver cirrhosis with portal HTN. Liver Bx 07/2019: stage 4 of 4 cirrhosis without infiltrative processes . Child's class A. MELD Na 19 (08/04/2019). Quit ETOH since May 01, 2019)  #2. Mild splenomegaly.  #3. Ascites (resolved) #4. Sub-clinical hepatic encephalopathy #5. Abn MRI 06/2019 (LI-RADS-3). Rpt MRI 09/2019- neg.  Plan: - Strictly No ETOH - [place] abdo - Low salt diet. - Continue rifaxamin 550mg  po bid - Continue rest of the medications for now. - Appt with Dawn Drazek  - FU in 6 months. At FU, check CBC, CMP, PT INR, AFP   HPI:    Frances Williams is a 40 y.o. female   For follow-up visit.  Feels significantly better.  No alcohol since May 01, 2019.  Has been having 2-3 softer bowel movements per day.  She is currently off lactulose and uses it only on a as needed basis.  She is taking rifaximin 550 mg p.o. twice daily.  No excessive confusion.  No asterixis.  Has been compliant with low-salt diet.  Compliant with medications.  No nausea, vomiting, heartburn, regurgitation, odynophagia or dysphagia.  No significant diarrhea.  No melena or hematochezia. No unintentional weight loss. No abdominal pain.  No change in mental status.  Wt Readings from Last 3 Encounters:  06/16/20 148 lb 2 oz (67.2 kg)  12/01/19 155 lb (70.3 kg)  08/07/19 156 lb (70.8 kg)   No swelling of the legs.  Denies having any fever chills or night sweats.  Her jaundice has considerably improved.  Prev labs: Labs 05/16/2019: Iron 31. Ferritin 125. Vitamin B12 1131. Ceruloplasmin 30. IgG 1468. A1AT 271. TSH 3.87. Ammonia 105. AFP 10.6.  ANA negative. AMA < 20. SMA < 20. Hepatitis A total ab reactive. Hepatitis B surface antigen nonreactive. Hepatitis  B surface antibody positive. Hepatitis B core IgM nonreactive. Hepatitis C antibody negative.  Past GI work-up -Had elevated AFP in past.   Repeat AFP was better.  MRI with contrast 09/2019 -Hepatic cirrhosis. No radiographic evidence of hepatocellular carcinoma. -Stable splenomegaly, consistent with portal venous hypertension.  Past Medical History:  Diagnosis Date  . Alcohol abuse   . Alcoholic liver disease ([place]) 05/16/2019  . Brain tumor ([place])    BENIGN REMOVAL JULY 2001  . Chronic kidney disease   . Hematemesis 05/16/2019  . Hepatomegaly 05/16/2019  . History of MI (myocardial infarction) 05/20/2019  . Hypertension   . Seizure ([place])   . Smoking history 07/21/2019    Past Surgical History:  Procedure Laterality Date  . BRAIN SURGERY     2001 on temporal lobe  . ESOPHAGOGASTRODUODENOSCOPY  05/02/2019   [facility] [place]  . IR TRANSCATHETER BX  07/10/2019  . IR US GUIDE VASC ACCESS RIGHT  07/10/2019  . IR VENOGRAM HEPATIC W HEMODYNAMIC EVALUATION  07/10/2019    Family History  Problem Relation Age of Onset  . Breast cancer Mother 25  . Ovarian cancer Paternal Grandmother   . Colon cancer Neg Hx   . Esophageal cancer Neg Hx     Social History   Tobacco Use  . Smoking status: Former Research scientist (life sciences)  . Smokeless tobacco: Never Used  . Tobacco comment: quit 05/01/2019  Vaping Use  . Vaping Use: Former  Substance Use Topics  . Alcohol use: Not Currently    Comment: quit 05/01/2019  . Drug use: Not  Currently    Types: "Crack" cocaine, Marijuana    Current Outpatient Medications  Medication Sig Dispense Refill  . B Complex Vitamins (B COMPLEX-B12 PO) Take by mouth daily.    Marland Kitchen escitalopram (LEXAPRO) 20 MG tablet Take 20 mg by mouth daily.    . folic acid (FOLVITE) 1 MG tablet Take 1 tablet (1 mg total) by mouth daily. 30 tablet 2  . furosemide (LASIX) 20 MG tablet Take 1 tablet (20 mg total) by mouth daily. 30 tablet 2  . lactulose (CHRONULAC) 10 GM/15ML solution TAKE 30ML TWICE DAILY 1800 mL 1  . metoprolol tartrate (LOPRESSOR) 25 MG tablet Take 0.5 tablets (12.5 mg total) by mouth 2 (two) times daily. 90 tablet 2   . Multiple Vitamins-Minerals (ONE-A-DAY WOMENS PO) Take by mouth daily.    . pantoprazole (PROTONIX) 40 MG tablet Take 1 tablet (40 mg total) by mouth 2 (two) times daily. 60 tablet 2  . Potassium Chloride ER 20 MEQ TBCR Take 1 tablet by mouth daily. 30 tablet 6  . rifaximin (XIFAXAN) 550 MG TABS tablet Take 1 tablet (550 mg total) by mouth 2 (two) times daily. NEEDS OFFICE VISIT FOR FURTHER REFILLS 60 tablet 0  . spironolactone (ALDACTONE) 50 MG tablet TAKE 1 TABLET BY MOUTH DAILY 30 tablet 1   No current facility-administered medications for this visit.    No Known Allergies  Review of Systems:  negh     Physical Exam:    BP 110/68 (BP Location: Right Arm, Patient Position: Sitting, Cuff Size: Normal)   Pulse (!) 110   Ht 5\' 8"  (1.727 m)   Wt 148 lb 2 oz (67.2 kg)   BMI 22.52 kg/m  Wt Readings from Last 3 Encounters:  06/16/20 148 lb 2 oz (67.2 kg)  12/01/19 155 lb (70.3 kg)  08/07/19 156 lb (70.8 kg)   Constitutional:  Well-developed, in no acute distress. Psychiatric: Normal mood and affect. Behavior is normal. HEENT: Pupils normal.  Conjunctivae are normal. Has scleral icterus. Cardiovascular: Normal rate, regular rhythm. No edema Pulmonary/chest: Effort normal and breath sounds normal. No wheezing, rales or rhonchi. Abdominal: Soft, nondistended. Nontender. Bowel sounds active throughout. There are no masses palpable.  Liver is palpated 4 cm below costal margin. Rectal:  defered Neurological: Alert and oriented to person place and time. Skin: Skin is warm and dry. No rashes noted.  Data Reviewed: I have personally reviewed following labs and imaging studies  CBC: CBC Latest Ref Rng & Units 06/14/2020 03/08/2020 01/20/2020  WBC 3.4 - 10.8 x10E3/uL 6.3 6.3 5.6  Hemoglobin 11.1 - 15.9 g/dL 12.2 12.0 12.2  Hematocrit 34.0 - 46.6 % 34.8 33.7(L) 35.0(L)  Platelets 150 - 450 x10E3/uL 193 145(L) 135.0(L)    CMP: CMP Latest Ref Rng & Units 06/14/2020 03/08/2020 01/20/2020   Glucose 65 - 99 mg/dL 82 102(H) 83  BUN 6 - 20 mg/dL 18 20 24(H)  Creatinine 0.57 - 1.00 mg/dL 0.81 0.84 0.79  Sodium 134 - 144 mmol/L 136 137 133(L)  Potassium 3.5 - 5.2 mmol/L 4.5 4.4 4.3  Chloride 96 - 106 mmol/L 99 100 97  CO2 20 - 29 mmol/L 19(L) 24 28  Calcium 8.7 - 10.2 mg/dL 9.6 9.7 9.7  Total Protein 6.0 - 8.5 g/dL 7.7 7.4 7.9  Total Bilirubin 0.0 - 1.2 mg/dL 4.6(H) 3.0(H) 3.5(H)  Alkaline Phos 44 - 121 IU/L 128(H) 143(H) 136(H)  AST 0 - 40 IU/L 45(H) 53(H) 49(H)  ALT 0 - 32 IU/L 27 29 35  GFR: Estimated Creatinine Clearance: 95.9 mL/min (by C-G formula based on SCr of 0.81 mg/dL). Liver Function Tests: Recent Labs  Lab 06/14/20 1223  AST 45*  ALT 27  ALKPHOS 128*  BILITOT 4.6*  PROT 7.7  ALBUMIN 4.7      Radiology Studies: No results found.    Carmell Austria, MD 06/16/2020, 9:47 AM  Cc: Glenis Smoker, *

## 2020-06-16 NOTE — Patient Instructions (Signed)
Medication Instructions:  Your physician recommends that you continue on your current medications as directed. Please refer to the Current Medication list given to you today.  *If you need a refill on your cardiac medications before your next appointment, please call your pharmacy*   Lab Work: Your physician recommends that you return for lab work when fasting: lipids   If you have labs (blood work) drawn today and your tests are completely normal, you will receive your results only by: . MyChart Message (if you have MyChart) OR . A paper copy in the mail If you have any lab test that is abnormal or we need to change your treatment, we will call you to review the results.   Testing/Procedures: None.    Follow-Up: At [facility], you and your health needs are our priority.  As part of our continuing mission to provide you with exceptional heart care, we have created designated Provider Care Teams.  These Care Teams include your primary Cardiologist (physician) and Advanced Practice Providers (APPs -  Physician Assistants and Nurse Practitioners) who all work together to provide you with the care you need, when you need it.  We recommend signing up for the patient portal called "MyChart".  Sign up information is provided on this After Visit Summary.  MyChart is used to connect with patients for Virtual Visits (Telemedicine).  Patients are able to view lab/test results, encounter notes, upcoming appointments, etc.  Non-urgent messages can be sent to your provider as well.   To learn more about what you can do with MyChart, go to [email or web address].    Your next appointment:   6 month(s)  The format for your next appointment:   In Person  Provider:   Robert Krasowski, MD   Other Instructions   

## 2020-06-16 NOTE — Patient Instructions (Addendum)
If you are age 38 or older, your body mass index should be between 23-30. Your Body mass index is 22.52 kg/m. If this is out of the aforementioned range listed, please consider follow up with your Primary Care Provider.  If you are age 2 or younger, your body mass index should be between 19-25. Your Body mass index is 22.52 kg/m. If this is out of the aformentioned range listed, please consider follow up with your Primary Care Provider.   You have been scheduled for an abdominal ultrasound at [facility]  on      at       . Please arrive 15 minutes prior to your appointment for registration. Make certain not to have anything to eat or drink 6 hours prior to your appointment. Should you need to reschedule your appointment, please contact radiology at [phone]. This test typically takes about 30 minutes to perform.  Please continue low salt diet and current medications.  You will be referred to [facility]. Their number is [phone]. You can call to see about setting up an appointment.  Please call in 6 months to schedule follow up appointment.   No alcohol.   Thank you,  Dr. Jackquline Denmark

## 2020-06-19 ENCOUNTER — Other Ambulatory Visit: Payer: Self-pay | Admitting: Gastroenterology

## 2020-06-19 DIAGNOSIS — K7030 Alcoholic cirrhosis of liver without ascites: Secondary | ICD-10-CM

## 2020-06-22 ENCOUNTER — Ambulatory Visit (HOSPITAL_BASED_OUTPATIENT_CLINIC_OR_DEPARTMENT_OTHER)
Admission: RE | Admit: 2020-06-22 | Discharge: 2020-06-22 | Disposition: A | Discharge status: Home / Self Care | Payer: BC Managed Care – PPO | Source: Ambulatory Visit | Attending: Gastroenterology | Admitting: Gastroenterology

## 2020-06-22 ENCOUNTER — Other Ambulatory Visit: Payer: Self-pay

## 2020-06-22 DIAGNOSIS — K7460 Unspecified cirrhosis of liver: Secondary | ICD-10-CM | POA: Diagnosis not present

## 2020-06-22 DIAGNOSIS — K7030 Alcoholic cirrhosis of liver without ascites: Secondary | ICD-10-CM | POA: Diagnosis not present

## 2020-07-19 ENCOUNTER — Other Ambulatory Visit: Payer: Self-pay | Admitting: Gastroenterology

## 2020-07-19 DIAGNOSIS — K7030 Alcoholic cirrhosis of liver without ascites: Secondary | ICD-10-CM

## 2020-07-21 DIAGNOSIS — K709 Alcoholic liver disease, unspecified: Secondary | ICD-10-CM | POA: Diagnosis not present

## 2020-07-21 DIAGNOSIS — F411 Generalized anxiety disorder: Secondary | ICD-10-CM | POA: Diagnosis not present

## 2020-08-16 ENCOUNTER — Telehealth: Payer: Self-pay | Admitting: Gastroenterology

## 2020-08-16 DIAGNOSIS — K7030 Alcoholic cirrhosis of liver without ascites: Secondary | ICD-10-CM

## 2020-08-16 MED ORDER — RIFAXIMIN 550 MG PO TABS
550.0000 mg | ORAL_TABLET | Freq: Two times a day (BID) | ORAL | 3 refills | Status: AC
Start: 2020-08-16 — End: ?

## 2020-08-16 NOTE — Telephone Encounter (Signed)
Inbound call from patient stating needs additional refills for Xifaxan medication to be sent to [facility] please.

## 2020-08-16 NOTE — Telephone Encounter (Signed)
Done

## 2020-08-19 ENCOUNTER — Other Ambulatory Visit: Payer: Self-pay | Admitting: Gastroenterology

## 2020-09-20 ENCOUNTER — Other Ambulatory Visit: Payer: Self-pay | Admitting: Gastroenterology

## 2020-10-18 ENCOUNTER — Other Ambulatory Visit: Payer: Self-pay | Admitting: Cardiology

## 2020-10-25 ENCOUNTER — Other Ambulatory Visit: Payer: Self-pay | Admitting: Gastroenterology

## 2020-10-25 DIAGNOSIS — K7030 Alcoholic cirrhosis of liver without ascites: Secondary | ICD-10-CM

## 2020-11-08 ENCOUNTER — Other Ambulatory Visit: Payer: Self-pay | Admitting: Gastroenterology

## 2020-11-08 DIAGNOSIS — K7030 Alcoholic cirrhosis of liver without ascites: Secondary | ICD-10-CM

## 2020-11-29 ENCOUNTER — Other Ambulatory Visit: Payer: Self-pay | Admitting: Gastroenterology

## 2021-02-05 ENCOUNTER — Other Ambulatory Visit: Payer: Self-pay | Admitting: Gastroenterology

## 2021-02-05 DIAGNOSIS — K7030 Alcoholic cirrhosis of liver without ascites: Secondary | ICD-10-CM

## 2021-03-07 ENCOUNTER — Other Ambulatory Visit: Payer: Self-pay | Admitting: Gastroenterology

## 2021-04-20 ENCOUNTER — Other Ambulatory Visit: Payer: Self-pay | Admitting: Cardiology

## 2021-04-20 NOTE — Telephone Encounter (Signed)
Rx refill sent to pharmacy. 

## 2021-12-19 ENCOUNTER — Other Ambulatory Visit: Payer: Self-pay | Admitting: Cardiology

## 2021-12-20 MED ORDER — METOPROLOL TARTRATE 25 MG PO TABS: 12.5000 mg | ORAL_TABLET | Freq: Two times a day (BID) | ORAL | 0 refills | Status: AC

## 2022-07-04 IMAGING — MR MR ABDOMEN WO/W CM
18 series · 48 of 48 positions shown · IV contrast (with contrast)
Comparison: None.

CLINICAL DATA: Alcoholic cirrhosis.

EXAM:
MRI ABDOMEN WITHOUT AND WITH CONTRAST
TECHNIQUE: Multiplanar multisequence MR imaging of the abdomen was performed
both before and after the administration of intravenous contrast.
CONTRAST:  7mL GADAVIST GADOBUTROL 1 MMOL/ML IV SOLN

[Series 2: haste_cor_mbh · coronal · 6.0mm · 1.56mm/px · 2 of 31 slices shown]
[im 1/31]
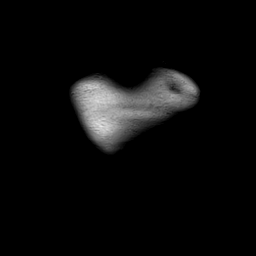
[im 31/31]
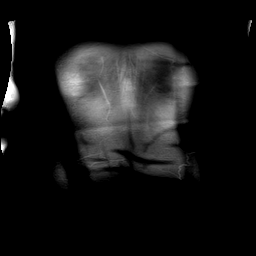

[Series 3: ax_trufi_mbh · axial · 6.0mm · 1.17mm/px · z∈[-190,+92]mm · 3 of 48 slices shown]
[im 1/48]
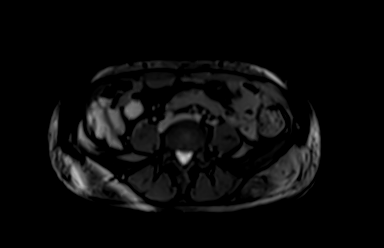
[im 24/48]
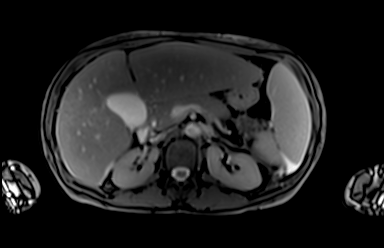
[im 48/48]
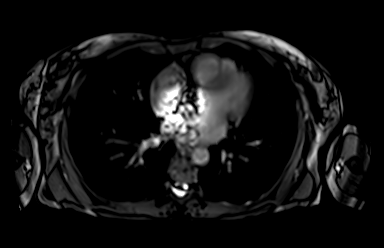

[Series 4: T2 fat-sat · axial · 6.0mm · 1.25mm/px · 1 of 40 slices shown]
[im 1/40]
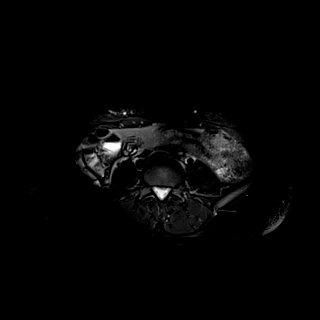

[Series 6: ax_diff_fb_tracew_dfc_mix · axial · 6.0mm · 1.68mm/px · z∈[-214,+125]mm · 3 of 96 slices shown]
[im 1/96]
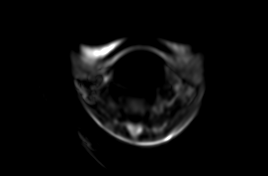
[im 48/96]
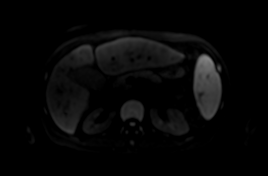
[im 96/96]
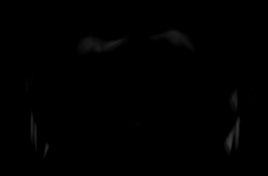

[Series 7: ax_diff_fb_adc_dfc_mix · axial · 6.0mm · 1.68mm/px · z∈[-214,+125]mm · 2 of 48 slices shown]
[im 1/48]
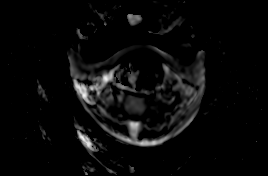
[im 48/48]
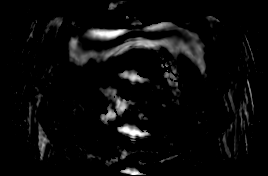

[Series 15: t1_vibe_e-dixon_tra_bh_pre_in_reg · axial · 3.0mm · 1.41mm/px · z∈[-187,+98]mm · 3 of 96 slices shown]
[im 1/96]
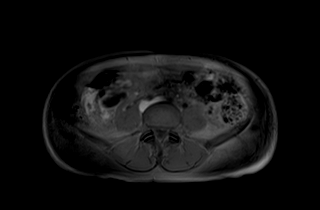
[im 48/96]
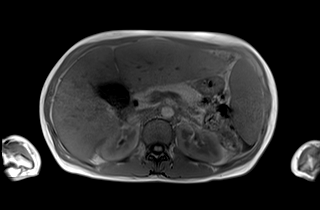
[im 96/96]
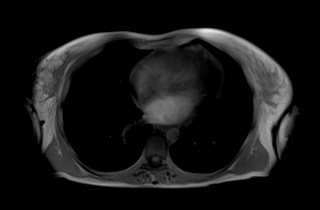

[Series 16: t1_vibe_e-dixon_tra_bh_pre_opp_reg · axial · 3.0mm · 1.41mm/px · z∈[-187,+98]mm · 3 of 96 slices shown]
[im 1/96]
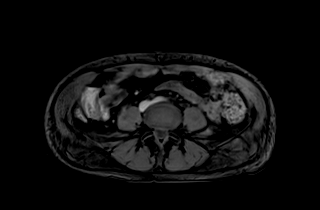
[im 48/96]
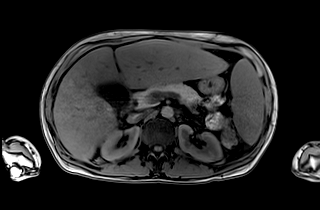
[im 96/96]
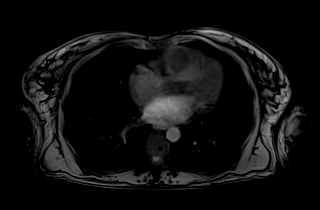

[Series 18: t1_vibe_e-dixon_tra_bh_pre_w_reg · axial · 3.0mm · 1.41mm/px · z∈[-187,+98]mm · 3 of 96 slices shown]
[im 1/96]
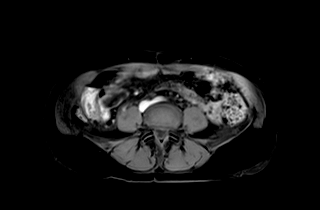
[im 48/96]
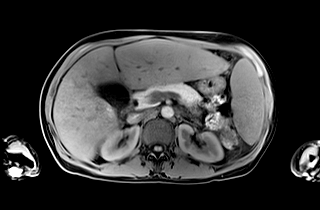
[im 96/96]
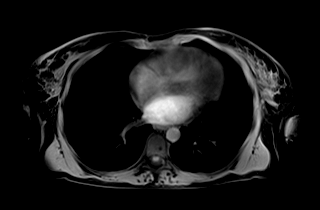

[Series 19: t1_vibe_dixon_tra_bh_arterial_w_reg · axial · 3.0mm · 1.41mm/px · z∈[-187,+98]mm · 3 of 96 slices shown]
[im 1/96]
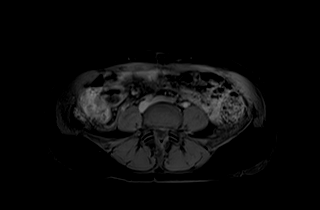
[im 48/96]
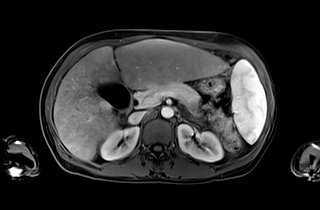
[im 96/96]
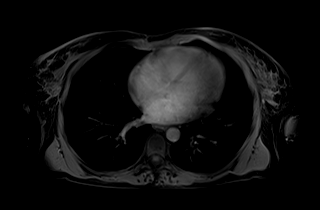

[Series 20: t1_vibe_dixon_tra_bh_arterial_w_sub · axial · 3.0mm · 1.41mm/px · z∈[-187,+98]mm · 3 of 96 slices shown]
[im 1/96]
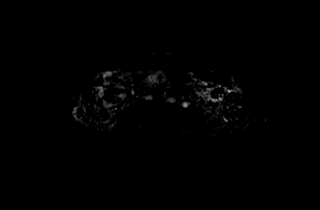
[im 48/96]
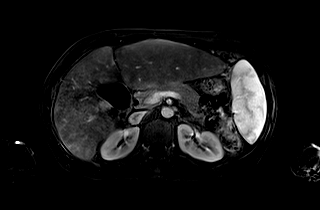
[im 96/96]
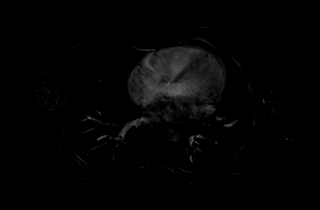

[Series 21: t1_vibe_dixon_tra_bh_venous_w_reg · axial · 3.0mm · 1.41mm/px · z∈[-187,+98]mm · 3 of 96 slices shown]
[im 1/96]
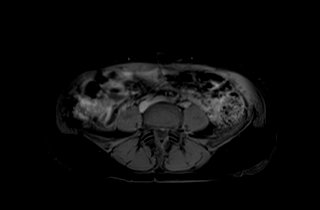
[im 48/96]
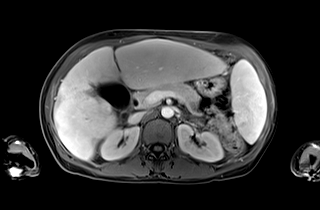
[im 96/96]
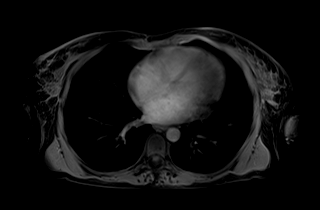

[Series 22: t1_vibe_dixon_tra_bh_venous_w_r_sub · axial · 3.0mm · 1.41mm/px · z∈[-187,+98]mm · 3 of 96 slices shown]
[im 1/96]
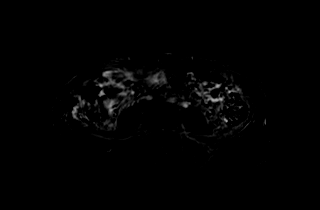
[im 48/96]
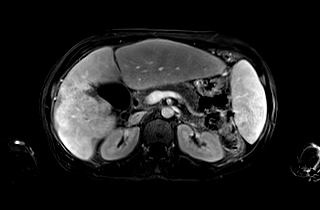
[im 96/96]
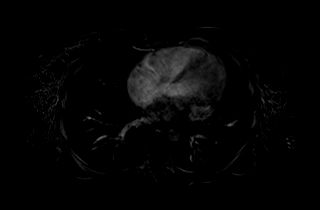

[Series 23: T2 · axial · 6.0mm · 1.56mm/px · 1 of 39 slices shown]
[im 1/39]
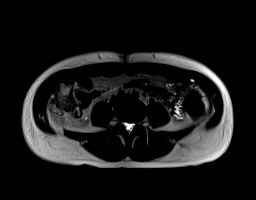

[Series 24: t1_vibe_dixon_tra_bh_delayed_w_reg · axial · 3.0mm · 1.41mm/px · z∈[-187,+98]mm · 3 of 96 slices shown]
[im 1/96]
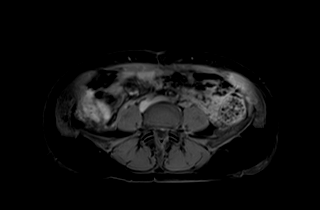
[im 48/96]
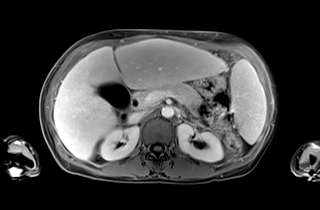
[im 96/96]
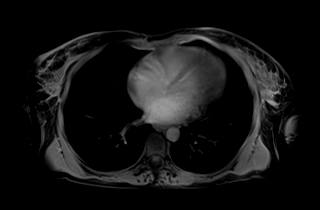

[Series 25: t1_vibe_dixon_tra_bh_delayed_w__sub · axial · 3.0mm · 1.41mm/px · z∈[-187,+98]mm · 3 of 96 slices shown]
[im 1/96]
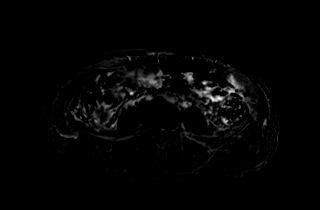
[im 48/96]
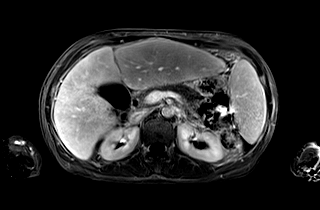
[im 96/96]
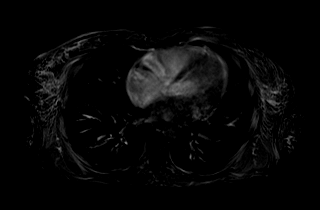

[Series 27: t1_vibe_dixon_cor_bh_post_w · coronal · 3.0mm · 1.76mm/px · 3 of 80 slices shown]
[im 1/80]
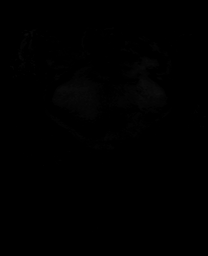
[im 40/80]
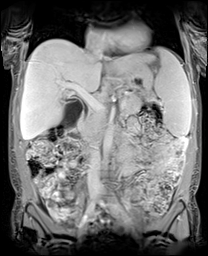
[im 80/80]
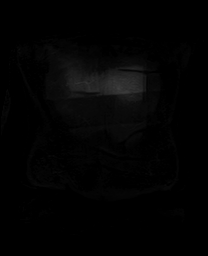

[Series 28: t1_vibe_dixon_tra_bh_3 min_w_reg · axial · 3.0mm · 1.41mm/px · z∈[-187,+98]mm · 3 of 96 slices shown]
[im 1/96]
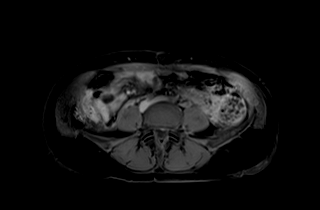
[im 48/96]
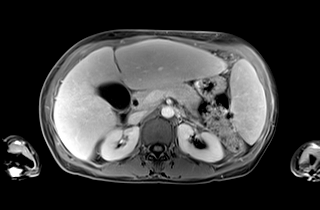
[im 96/96]
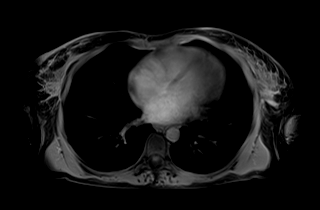

[Series 29: t1_vibe_dixon_tra_bh_3 min_w_re_sub · axial · 3.0mm · 1.41mm/px · z∈[-187,+98]mm · 3 of 96 slices shown]
[im 1/96]
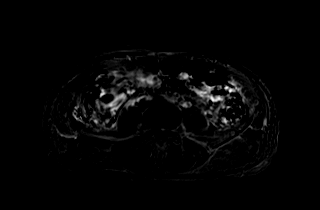
[im 48/96]
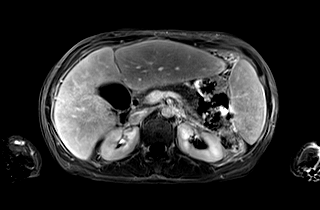
[im 96/96]
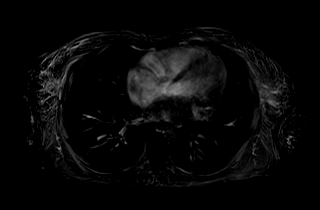

[48 of 48 positions shown; findings below may reference images not displayed]

FINDINGS: Lower chest: No acute findings.

Hepatobiliary: Hepatic cirrhosis is again demonstrated. Areas of
capsular scarring retraction are again seen along lateral right
hepatic lobe. Heterogeneous enhancement the hepatic parenchyma is
seen, however no focal lesions with arterial phase hyperenhancement
or contrast washout are seen. Previously seen small hypointensity in
the right hepatic lobe is no longer visualized on today's exam. No
focal hepatic abnormality seen on diffusion imaging.

Gallbladder is unremarkable. No evidence of biliary ductal
dilatation.

Pancreas:  No mass or inflammatory changes.

Spleen: Stable mild-to-moderate splenomegaly, consistent with portal
venous hypertension.

Adrenals/Urinary Tract: No masses identified. No evidence of
hydronephrosis.

Stomach/Bowel: Visualized portion unremarkable.

Vascular/Lymphatic: No pathologically enlarged lymph nodes
identified. No abdominal aortic aneurysm.

Other:  None.

Musculoskeletal:  No suspicious bone lesions identified.
IMPRESSION: Hepatic cirrhosis. No radiographic evidence of hepatocellular
carcinoma.

Stable splenomegaly, consistent with portal venous hypertension.

## 2022-07-05 ENCOUNTER — Other Ambulatory Visit (HOSPITAL_COMMUNITY): Payer: Self-pay

## 2022-07-05 ENCOUNTER — Telehealth: Payer: Self-pay | Admitting: Pharmacy Technician

## 2022-07-05 NOTE — Telephone Encounter (Signed)
Patient Advocate Encounter  Received RENEWAL notification from [facility] that prior authorization for XIFAXAN 550MG  is required.   PA NOT submitted on 5.29.24 Key [id number] Status is pending  Test billing with [facility], showed PA is not needed at this time for this medication. It returns a $0 copay for #42 tabs/14 days w/eVoucher.

## 2022-08-21 DIAGNOSIS — R252 Cramp and spasm: Secondary | ICD-10-CM | POA: Diagnosis not present

## 2022-08-21 DIAGNOSIS — K7030 Alcoholic cirrhosis of liver without ascites: Secondary | ICD-10-CM | POA: Diagnosis not present

## 2022-08-21 DIAGNOSIS — Z23 Encounter for immunization: Secondary | ICD-10-CM | POA: Diagnosis not present

## 2022-08-21 DIAGNOSIS — Z139 Encounter for screening, unspecified: Secondary | ICD-10-CM | POA: Diagnosis not present

## 2022-08-21 DIAGNOSIS — Z114 Encounter for screening for human immunodeficiency virus [HIV]: Secondary | ICD-10-CM | POA: Diagnosis not present

## 2022-08-21 DIAGNOSIS — Z131 Encounter for screening for diabetes mellitus: Secondary | ICD-10-CM | POA: Diagnosis not present

## 2022-08-21 DIAGNOSIS — I10 Essential (primary) hypertension: Secondary | ICD-10-CM | POA: Diagnosis not present

## 2022-08-21 DIAGNOSIS — Z13220 Encounter for screening for lipoid disorders: Secondary | ICD-10-CM | POA: Diagnosis not present

## 2022-08-21 DIAGNOSIS — Z1159 Encounter for screening for other viral diseases: Secondary | ICD-10-CM | POA: Diagnosis not present

## 2022-09-29 DIAGNOSIS — K7030 Alcoholic cirrhosis of liver without ascites: Secondary | ICD-10-CM | POA: Diagnosis not present

## 2022-10-27 DIAGNOSIS — K7030 Alcoholic cirrhosis of liver without ascites: Secondary | ICD-10-CM | POA: Diagnosis not present

## 2022-10-27 DIAGNOSIS — R188 Other ascites: Secondary | ICD-10-CM | POA: Diagnosis not present

## 2022-10-27 DIAGNOSIS — K7460 Unspecified cirrhosis of liver: Secondary | ICD-10-CM | POA: Diagnosis not present

## 2022-10-27 DIAGNOSIS — R161 Splenomegaly, not elsewhere classified: Secondary | ICD-10-CM | POA: Diagnosis not present

## 2022-11-01 DIAGNOSIS — Z23 Encounter for immunization: Secondary | ICD-10-CM | POA: Diagnosis not present

## 2022-11-01 DIAGNOSIS — Z0000 Encounter for general adult medical examination without abnormal findings: Secondary | ICD-10-CM | POA: Diagnosis not present

## 2022-11-01 DIAGNOSIS — Z124 Encounter for screening for malignant neoplasm of cervix: Secondary | ICD-10-CM | POA: Diagnosis not present
